# Patient Record
Sex: Male | Born: 1955 | Race: White | Hispanic: No | Smoking: Never smoker
Health system: Southern US, Community
[De-identification: ages and names within clinical notes are randomized; demographics above are authoritative.]

## PROBLEM LIST (undated history)

## (undated) DIAGNOSIS — E785 Hyperlipidemia, unspecified: Secondary | ICD-10-CM

## (undated) DIAGNOSIS — N200 Calculus of kidney: Secondary | ICD-10-CM

## (undated) HISTORY — DX: Hyperlipidemia, unspecified: E78.5

---

## 2009-03-29 ENCOUNTER — Ambulatory Visit: Payer: Self-pay | Admitting: Family Medicine

## 2009-03-29 DIAGNOSIS — E785 Hyperlipidemia, unspecified: Secondary | ICD-10-CM | POA: Insufficient documentation

## 2009-03-29 DIAGNOSIS — H00019 Hordeolum externum unspecified eye, unspecified eyelid: Secondary | ICD-10-CM

## 2009-06-21 ENCOUNTER — Ambulatory Visit: Payer: Self-pay | Admitting: Family Medicine

## 2010-01-01 ENCOUNTER — Ambulatory Visit: Payer: Self-pay | Admitting: Family Medicine

## 2010-01-01 DIAGNOSIS — H900 Conductive hearing loss, bilateral: Secondary | ICD-10-CM | POA: Insufficient documentation

## 2010-02-28 ENCOUNTER — Encounter: Payer: Self-pay | Admitting: Family Medicine

## 2010-02-28 LAB — CONVERTED CEMR LAB
Alkaline Phosphatase: 55 units/L (ref 39–117)
BUN: 16 mg/dL (ref 6–23)
Cholesterol: 232 mg/dL — ABNORMAL HIGH (ref 0–200)
Glucose, Bld: 91 mg/dL (ref 70–99)
HDL: 41 mg/dL (ref >39)
LDL Cholesterol: 140 mg/dL — ABNORMAL HIGH (ref 0–99)
Total Bilirubin: 0.6 mg/dL (ref 0.3–1.2)
Triglycerides: 256 mg/dL — ABNORMAL HIGH (ref <150)
VLDL: 51 mg/dL — ABNORMAL HIGH (ref 0–40)

## 2010-06-11 ENCOUNTER — Ambulatory Visit: Payer: Self-pay | Admitting: Family Medicine

## 2010-08-06 NOTE — Assessment & Plan Note (Signed)
Summary: NOV stye   Vital Signs:  Patient profile:   55 year old male Height:      71 inches Weight:      195 pounds BMI:     27.30 O2 Sat:      97 % on Room air Pulse rate:   60 / minute BP sitting:   127 / 83  (left arm) Cuff size:   large  Vitals Entered By: Payton Spark CMA (January 01, 2010 1:38 PM)  O2 Flow:  Room air CC: New to est. Stye R eye x months. Also didn't do well on hearing test for DOT physical.  Hearing Screen 40db HL: Left  500 hz: No Response 1000 hz: 25db 2000 hz: 25db 4000 hz: 40db Right  500 hz: No Response 1000 hz: 25db 2000 hz: 25db 4000 hz: 40db    Primary Care Provider:  Seymour Bars DO  CC:  New to est. Stye R eye x months. Also didn't do well on hearing test for DOT physical..  History of Present Illness: 55 yo WM presents for NOV.  He is a Naval architect.  He had high cholesterol in the past.  It has been 2 yrs since labs were drawn.    Has had a stye on the R eye for severeal months.  Use Gentamycin opthalmic but it did not help.  No vision impairement.  Using OTC genteal eyedrops.  He does rub his eyes a lot.  When he went for his DOT physical, he failed his whisper test and did the hearing booth but couldn't focus on hearing b/c his heart was pounded.    Current Medications (verified): 1)  Gentamycin Opth Ointment .... Apply Small Amt To Right Eye Three Times Daily  Allergies (verified): No Known Drug Allergies  Past History:  Past Medical History: Reviewed history from 03/29/2009 and no changes required. Hyperlipidemia  Past Surgical History: Reviewed history from 03/29/2009 and no changes required. Denies surgical history  Family History: Mother, HTN Father, UNK  no sibblings  Social History: 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    Review of Systems       no fevers/sweats/weakness, unexplained wt loss/gain, no change in vision, no difficulty hearing, ringing in ears, no hay  fever/allergies, no CP/discomfort, no palpitations, no breast lump/nipple discharge, no cough/wheeze, no blood in stool, no N/V/D, no nocturia, no leaking urine, no unusual vag bleeding, no vaginal/penile discharge, no muscle/joint pain, no rash, no new/changing mole, no HA, no memory loss, no anxiety, no sleep problem, no depression, no unexplained lumps, no easy bruising/bleeding, no concern with sexual function    Physical Exam  General:  Well-developed,well-nourished,in no acute distress; alert,appropriate and cooperative throughout examination Head:  normocephalic and atraumatic.   Eyes:  tiny hordoleum on the upper and lower lids (inside), conjunctiva clear Nose:  no nasal discharge.   Mouth:  good dentition and pharynx pink and moist.   Neck:  no masses.   Lungs:  Normal respiratory effort, chest expands symmetrically. Lungs are clear to auscultation, no crackles or wheezes. Heart:  Normal rate and regular rhythm. S1 and S2 normal without gallop, murmur, click, rub or other extra sounds. Skin:  color normal.   Psych:  good eye contact, not anxious appearing, and not depressed appearing.     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 Did not improve with  topical gentamycin. Treat with warm water compresses and cleaning lids with baby shampoo and warm water. If not improved in 2 wks, refer to Dr Pecola Leisure.  Problem # 2:  HYPERLIPIDEMIA (ICD-272.4) Will update his fasting labs and f/u results.  Schedule CPE in 6 mos. 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  Since he has to pass his DOT in 2 yrs, suggest formal audology visit prior to this.  Orders: Audiometry [id number])  Complete Medication  List: 1)  Fish Oil 1000 Mg Caps (Omega-3 fatty acids) 2)  Vitamin B-12 1000 Mcg Tabs (Cyanocobalamin)  Other Orders: T-Comprehensive Metabolic Panel ([id number]) T-PSA ([id number])  Patient Instructions: 1)  Clean eyelid with a drop of baby shampoo with warm water on a washcloth from inside to outside corner 2 x a day.  Use plain warm water on a washclot during the day - apply for 5-10 min 3 x  day.   2)  If stye not improving in 2-3 wks, call and will get you into Dr Pecola Leisure. 3)  Update fasting labs. 4)  Will call you w/ results. 5)  Return for a PHYSICAL in 6 mos.   Orders Added: 1)  T-Comprehensive Metabolic Panel [[id number]] 2)  T-Lipid Profile [[id number]] 3)  T-PSA [[id number]] 4)  New Patient Level III [99203] 5)  Audiometry [34742]

## 2010-08-06 NOTE — Assessment & Plan Note (Signed)
Summary: CPE   Vital Signs:  Patient profile:   55 year old male Height:      71 inches Weight:      201 pounds BMI:     28.14 O2 Sat:      99 % on Room air Pulse rate:   65 / minute BP sitting:   124 / 78  (left arm) Cuff size:   large  Vitals Entered By: Payton Spark CMA (June 11, 2010 1:27 PM)  O2 Flow:  Room air CC: CPE. Never started crestor.   Primary Care Provider:  Seymour Bars DO  CC:  CPE. Never started crestor.Marland Kitchen  History of Present Illness: 55 yo WM presents for CPE.   He had fasting labs done in August but he was never started on Crestor becasue he was scared of it.  His tetanus was updated last year.  Had a colonoscopy done at age 29, given 10 yrs before repeat. He is due for DRE, had PSA with labs in Aug.  Denies fam hx of colon or prostate cancer or of premature heart dz.  He has been trying to eat healthier but does not regularly exercise.  Allergies (verified): No Known Drug Allergies  Past History:  Past Medical History: Reviewed history from 03/29/2009 and no changes required. Hyperlipidemia  Past Surgical History: Reviewed history from 03/29/2009 and no changes required. Denies surgical history  Family History: Reviewed history from 01/01/2010 and no changes required. Mother, HTN Father, UNK  no sibblings  Social History: Reviewed history from 01/01/2010 and no changes required. 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    Review of Systems  The patient denies anorexia, fever, weight loss, weight gain, vision loss, decreased hearing, hoarseness, chest pain, syncope, dyspnea on exertion, peripheral edema, prolonged cough, headaches, hemoptysis, abdominal pain, melena, hematochezia, severe indigestion/heartburn, hematuria, incontinence, genital sores, muscle weakness, suspicious skin lesions, transient blindness, difficulty walking, depression, unusual weight change, abnormal bleeding, enlarged lymph nodes,  angioedema, breast masses, and testicular masses.    Physical Exam  General:  alert, well-developed, well-nourished, and well-hydrated.   Head:  normocephalic and atraumatic.   Eyes:  pupils equal, pupils round, and pupils reactive to light.   Ears:  EACs patent; TMs translucent and gray with good cone of light and bony landmarks.  Nose:  no nasal discharge.   Mouth:  good dentition and pharynx pink and moist.   Neck:  no masses.   Lungs:  Normal respiratory effort, chest expands symmetrically. Lungs are clear to auscultation, no crackles or wheezes. Heart:  Normal rate and regular rhythm. S1 and S2 normal without gallop, murmur, click, rub or other extra sounds. Abdomen:  Bowel sounds positive,abdomen soft and non-tender without masses, organomegaly, no AA bruits Rectal:  No external abnormalities noted. Normal sphincter tone. No rectal masses or tenderness. hemoccult neg Prostate:  Prostate gland firm and smooth, no enlargement, nodularity, tenderness, mass, asymmetry or induration. Pulses:  2+ radial and pedal pulses Extremities:  no LE edema Skin:  color normal and no suspicious lesions.   Cervical Nodes:  No lymphadenopathy noted Psych:  good eye contact, not anxious appearing, and not depressed appearing.     Impression & Recommendations:  Problem # 1:  HEALTH MAINTENANCE EXAM (ICD-V70.0) Keeping healthy checklist for men reviewed. BP at goal.  BMI 28= overwt Working on [employer], regular exercise, wt loss. Add MVI daily, + Omega 3 Fish Oil 4 g/ day for hyperlipidemia, recheck labs  in 3 mos -- hold off on statin x 3 mos. Colonscopy due at 63. Immunizations are UTD. DRE today, normal.  PSA UTD.  Complete Medication List: 1)  Fish Oil 1000 Mg Caps (Omega-3 fatty acids) 2)  Vitamin B-12 1000 Mcg Tabs (Cyanocobalamin) 3)  Crestor 10 Mg Tabs (Rosuvastatin calcium) .Marland Kitchen.. 1 tab by mouth qhs  Other Orders: DRE (G0102)  Patient Instructions: 1)  Work on healthy/ LOW  CHOLESTEROL diet with regular exercise (aim for 45 min 4 x a wk). 2)  Add Omega 3 Fish Oil 4 grams daily.  Keep in the fridge.   3)  Recheck lipids after 3 mos.   Orders Added: 1)  Est. Patient age 71-64 [99396] 2)  DRE [G0102]

## 2010-10-14 ENCOUNTER — Inpatient Hospital Stay (INDEPENDENT_AMBULATORY_CARE_PROVIDER_SITE_OTHER)
Admission: RE | Admit: 2010-10-14 | Discharge: 2010-10-14 | Disposition: A | Discharge status: Home / Self Care | Payer: BLUE CROSS/BLUE SHIELD | Source: Ambulatory Visit | Attending: Emergency Medicine | Admitting: Emergency Medicine

## 2010-10-14 ENCOUNTER — Encounter: Payer: Self-pay | Admitting: Emergency Medicine

## 2010-10-14 DIAGNOSIS — S61209A Unspecified open wound of unspecified finger without damage to nail, initial encounter: Secondary | ICD-10-CM

## 2010-10-15 ENCOUNTER — Telehealth (INDEPENDENT_AMBULATORY_CARE_PROVIDER_SITE_OTHER): Payer: Self-pay | Admitting: *Deleted

## 2010-10-16 ENCOUNTER — Telehealth (INDEPENDENT_AMBULATORY_CARE_PROVIDER_SITE_OTHER): Payer: Self-pay | Admitting: *Deleted

## 2010-12-20 ENCOUNTER — Inpatient Hospital Stay (INDEPENDENT_AMBULATORY_CARE_PROVIDER_SITE_OTHER)
Admission: RE | Admit: 2010-12-20 | Discharge: 2010-12-20 | Disposition: A | Discharge status: Home / Self Care | Payer: BLUE CROSS/BLUE SHIELD | Source: Ambulatory Visit | Attending: Family Medicine | Admitting: Family Medicine

## 2010-12-20 ENCOUNTER — Encounter: Payer: Self-pay | Admitting: Family Medicine

## 2010-12-20 DIAGNOSIS — K5900 Constipation, unspecified: Secondary | ICD-10-CM

## 2010-12-20 DIAGNOSIS — R634 Abnormal weight loss: Secondary | ICD-10-CM | POA: Insufficient documentation

## 2010-12-21 ENCOUNTER — Encounter: Payer: Self-pay | Admitting: Family Medicine

## 2010-12-24 ENCOUNTER — Other Ambulatory Visit: Payer: Self-pay | Admitting: Gastroenterology

## 2010-12-26 ENCOUNTER — Other Ambulatory Visit: Payer: BLUE CROSS/BLUE SHIELD

## 2011-02-26 ENCOUNTER — Encounter: Payer: Self-pay | Admitting: Family Medicine

## 2011-03-07 ENCOUNTER — Ambulatory Visit (INDEPENDENT_AMBULATORY_CARE_PROVIDER_SITE_OTHER): Payer: BLUE CROSS/BLUE SHIELD | Admitting: Family Medicine

## 2011-03-07 DIAGNOSIS — E785 Hyperlipidemia, unspecified: Secondary | ICD-10-CM

## 2011-03-07 DIAGNOSIS — R1012 Left upper quadrant pain: Secondary | ICD-10-CM

## 2011-03-07 LAB — LIPID PANEL
Cholesterol: 198 mg/dL (ref 0–200)
Triglycerides: 267 mg/dL — ABNORMAL HIGH (ref <150)
VLDL: 53 mg/dL — ABNORMAL HIGH (ref 0–40)

## 2011-03-07 LAB — LIPASE: Lipase: 20 U/L (ref 0–75)

## 2011-03-07 NOTE — Patient Instructions (Signed)
We will call you with the lab results. 

## 2011-03-07 NOTE — Progress Notes (Signed)
  Subjective:    Patient ID: Mark Carson, male    DOB: 09-03-1955, 55 y.o.   MRN: [id number]  HPI 3 weeks of pain over the left lower chest over the ribs.  Pain is 1/10. Worse after drinks cold water.  Can last about 30 sec. A couple of months ago went to UC was having abdominal pain. 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  No radiation of pain.  No nausease.  No change in bowels. No blood in the stool. No SOB.  Says he saw Dr. [facility] in [place] and she had recommended scan to evaluate further. He says by then he was feeling better by just changing his diet.  He denies any recent GERD sxs.  NO CP or SOB. Doesn't bother him with foods or with with activity or work.    Review of Systems     Objective:   Physical Exam  Constitutional: He appears well-developed and well-nourished.  Cardiovascular: Normal rate and regular rhythm.   Pulmonary/Chest: Effort normal and breath sounds normal.  Abdominal: Soft. Bowel sounds are normal. He exhibits no distension and no mass. There is no tenderness. There is no rebound and no guarding.  Skin: Skin is warm and dry.  Psychiatric: He has a normal mood and affect. His behavior is normal.          Assessment & Plan:  LUQ pain -Gave ressurance. Consider GERD. Right now only happens for a few seconds when drinks cold water. Unlikley to be pancreatitis but will check levels. Also will check CBC to make sure no sign of infection.  Hyperlipidemia -Due for lipids as well.

## 2011-03-08 LAB — COMPLETE METABOLIC PANEL WITH GFR
AST: 15 U/L (ref 0–37)
Alkaline Phosphatase: 60 U/L (ref 39–117)
BUN: 16 mg/dL (ref 6–23)
Calcium: 9.8 mg/dL (ref 8.4–10.5)
Chloride: 105 mEq/L (ref 96–112)
Creat: 1.06 mg/dL (ref 0.50–1.35)
GFR, Est Non African American: >60 mL/min (ref >60)
Glucose, Bld: 94 mg/dL (ref 70–99)

## 2011-03-10 ENCOUNTER — Telehealth: Payer: Self-pay | Admitting: Family Medicine

## 2011-03-10 NOTE — Telephone Encounter (Signed)
Call pt: CMP is normal. Cholesterol looks much much better. Almost at goal. [place] work. Keeep up the good job.

## 2011-03-11 NOTE — Telephone Encounter (Signed)
Left message on vm

## 2011-06-09 NOTE — Progress Notes (Signed)
Summary: SPLITER IN RIGHT RING FINGER? (rm 5)   Vital Signs:  Patient Profile:   55 Years Old Male CC:      splinter in left 4th finger x yesterday Height:     71 inches Weight:      190 pounds O2 Sat:      99 % O2 treatment:    Room Air Temp:     97.8 degrees F oral Pulse rate:   59 / minute Resp:     16 per minute BP sitting:   126 / 79  (right arm) Cuff size:   regular  Vitals Entered By: Lajean Saver RN (October 14, 2010 2:29 PM)                  Updated Prior Medication List: FISH OIL 1000 MG CAPS (OMEGA-3 FATTY ACIDS)   Current Allergies: No known allergies History of Present Illness History from: patient Chief Complaint: splinter in left 4th finger x yesterday History of Present Illness: Patient got a wood splinter in his L ring finger yesterday.  It was from a clean 2x4. He was able to pull out a small piece (about 1cm in length).  Today he has noticed redness and swelling and tenderness.  He is wondering if there is still a splinter imbedding in the finger.  No meds or modalities are used.    REVIEW OF SYSTEMS Constitutional Symptoms      Denies fever, chills, night sweats, weight loss, weight gain, and fatigue.  Eyes       Denies change in vision, eye pain, eye discharge, glasses, contact lenses, and eye surgery. Ear/Nose/Throat/Mouth       Denies hearing loss/aids, change in hearing, ear pain, ear discharge, dizziness, frequent runny nose, frequent nose bleeds, sinus problems, sore throat, hoarseness, and tooth pain or bleeding.  Respiratory       Denies dry cough, productive cough, wheezing, shortness of breath, asthma, bronchitis, and emphysema/COPD.  Cardiovascular       Denies murmurs, chest pain, and tires easily with exhertion.    Gastrointestinal       Denies stomach pain, nausea/vomiting, diarrhea, constipation, blood in bowel movements, and indigestion. Genitourniary       Denies painful urination, kidney stones, and loss of urinary  control. Neurological       Denies paralysis, seizures, and fainting/blackouts. Musculoskeletal       Complains of redness and swelling.      Denies muscle pain, joint pain, joint stiffness, decreased range of motion, muscle weakness, and gout.      Comments: left 4th finger Skin       Denies bruising, unusual mles/lumps or sores, and hair/skin or nail changes.  Psych       Denies mood changes, temper/anger issues, anxiety/stress, speech problems, depression, and sleep problems. Other Comments: Splinter to left 4th finger yesterday. He pulled most of it out but still red/swollen and drainaing clear fluid. :ast TDaP < 2 years ago   Past History:  Past Medical History: Reviewed history from 03/29/2009 and no changes required. Hyperlipidemia  Past Surgical History: Reviewed history from 03/29/2009 and no changes required. Denies surgical history  Family History: Reviewed history from 01/01/2010 and no changes required. Mother, HTN Father, UNK  no sibblings  Social History: Reviewed history from 01/01/2010 and no changes required. 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   Physical Exam General appearance: well developed, well nourished, no acute distress MSE: oriented  to time, place, and person L 4th finger volar aspect with redness, slight swelling, mild tenderness and small puncture mark.  I cannot see any type of foreign object or discoloration that would indicate a retained splinter.  There is slight clear drainage from the wound.  Finger with FROM of DIP, PIP, and MCP.  Normal sensation and cap refill <2 sec. Assessment New Problems: WOUND, FINGER (ICD-883.0)   Patient Education: Patient and/or caregiver instructed in the following: Ibuprofen prn.  Plan New Medications/Changes: BACTRIM DS 800-160 MG TABS (SULFAMETHOXAZOLE-TRIMETHOPRIM) 1 by mouth two times a day for 10 days  #20 x 0, 10/14/2010, Hoyt Koch MD  New Orders: Est.  Patient Level III [phone] Planning Comments:   My main concern is with a finger felon.  I do think it's infected and will treat to cover staph & strep with Bactrim DS.  Soak in warm water.  I doubt that an Xray would be worthwhile since a wood splinter would be very difficult to see.  Perhaps an ultrasound would be better.  I have explained to the patient that these infections can easily become much worse causing abscess formation and necrosis of the tissues or osteomyelitis.  Will call him tomorrow and the next day to see how he's doing.  If not improving (or if worsening), would suggest that he see a hand surgeon for possible exploration of the wound.   The patient and/or caregiver has been counseled thoroughly with regard to medications prescribed including dosage, schedule, interactions, rationale for use, and possible side effects and they verbalize understanding.  Diagnoses and expected course of recovery discussed and will return if not improved as expected or if the condition worsens. Patient and/or caregiver verbalized understanding.  Prescriptions: BACTRIM DS 800-160 MG TABS (SULFAMETHOXAZOLE-TRIMETHOPRIM) 1 by mouth two times a day for 10 days  #20 x 0   Entered and Authorized by:   Hoyt Koch MD   Signed by:   Hoyt Koch MD on 10/14/2010   Method used:   Print then Give to Patient   RxID:   [id number]   Orders Added: 1)  Est. Patient Level III [95621]

## 2011-06-09 NOTE — Telephone Encounter (Signed)
  Phone Note Outgoing Call   Call placed by: Clemens Catholic LPN,  October 15, 2010 3:03 PM Call placed to: Patient Summary of Call: call back: left message to call back with any questions or concerns. Initial call taken by: Clemens Catholic LPN,  October 15, 2010 3:03 PM

## 2011-06-09 NOTE — Telephone Encounter (Signed)
  Phone Note Outgoing Call   Call placed by: Clemens Catholic LPN,  October 16, 2010 9:30 AM Call placed to: Patient Summary of Call: call back: called and left message for the pt to call back if he is not improving or has any questions or concerns.  Initial call taken by: Clemens Catholic LPN,  October 16, 2010 9:31 AM

## 2011-06-09 NOTE — Progress Notes (Signed)
Summary: RAPID WEIGHT LOSS (rm 2)   Vital Signs:  Patient Profile:   55 Years Old Male CC:      weight loss Height:     71 inches Weight:      180.50 pounds BMI:     25.27 O2 Sat:      98 % O2 treatment:    Room Air Temp:     98.0 degrees F oral Pulse rate:   60 / minute Resp:     16 per minute BP sitting:   123 / 79  (left arm) Cuff size:   regular  Vitals Entered By: Lajean Saver RN (December 20, 2010 11:42 AM)                  Updated Prior Medication List: FISH OIL 1000 MG CAPS (OMEGA-3 FATTY ACIDS)   Current Allergies: No known allergies History of Present Illness Chief Complaint: weight loss History of Present Illness:  Subjective:  Patient complains of developing an episode of constipation about 3 weeks ago that lasted about a week.  During that time his appetite was decreased and he developed mild nausea without vomiting.  He also lost weight.  The constipation resolved after taking Dulcolax.  His weight stabilized afterwards.  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  3 days ago he again developed constipation and increased bloating/gas.  Also had mild nausea without vomiting.  No fevers, chills, and sweats.  No blood in stool.  He has had no changes in stools prior to the constipation but admits that he often has to strain during bowel movements.  He feels well otherwise. No family history of GI disorders or thyroid disorders.  REVIEW OF SYSTEMS Constitutional Symptoms       Complains of weight loss.     Denies fever, chills, night sweats, weight gain, and fatigue.  Eyes       Denies change in vision, eye pain, eye discharge, glasses, contact lenses, and eye surgery. Ear/Nose/Throat/Mouth       Denies hearing loss/aids, change in hearing, ear pain, ear discharge, dizziness, frequent runny nose, frequent nose bleeds, sinus problems, sore throat, hoarseness, and tooth pain or bleeding.  Respiratory       Denies dry cough, productive cough,  wheezing, shortness of breath, asthma, bronchitis, and emphysema/COPD.  Cardiovascular       Denies murmurs, chest pain, and tires easily with exhertion.    Gastrointestinal       Complains of stomach pain and constipation.      Denies nausea/vomiting, diarrhea, blood in bowel movements, and indigestion. Genitourniary       Denies painful urination, kidney stones, and loss of urinary control. Neurological       Denies paralysis, seizures, and fainting/blackouts. Musculoskeletal       Denies muscle pain, joint pain, joint stiffness, decreased range of motion, redness, swelling, muscle weakness, and gout.  Skin       Denies bruising, unusual mles/lumps or sores, and hair/skin or nail changes.  Psych       Denies mood changes, temper/anger issues, anxiety/stress, speech problems, depression, and sleep problems. Other Comments: Patient "was sick with a stomach virus" 3 weeks ago. He had no appetite, abd pains and c/o constipation. He c/o losing 10 pounds then. In the past week he c/o same symptoms and a loss of 5 pounds. He is concerned about his weight loss. On his scale this AM without clothes on he weighted 169. On  our scale with clothes he weighed 180.8lbs.    Past History:  Past Medical History: Reviewed history from 03/29/2009 and no changes required. Hyperlipidemia  Past Surgical History: Reviewed history from 03/29/2009 and no changes required. Denies surgical history  Family History: Reviewed history from 01/01/2010 and no changes required. Mother, HTN Father, UNK  no sibblings  Social History: Reviewed history from 01/01/2010 and no changes required. 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     Objective:  Appearance:  Patient appears healthy, stated age, and in no acute distress  Eyes:  Pupils are equal, round, and reactive to light and accomodation.  Extraocular movement is intact.  Conjunctivae are not inflamed.  Mouth/pharynx:   Normal Neck:  Supple.  No adenopathy is present.  No thyromegaly is present  Lungs:  Clear to auscultation.  Breath sounds are equal.  Heart:  Regular rate and rhythm without murmurs, rubs, or gallops.  Abdomen:  Mild tenderness over descending colon without masses or hepatosplenomegaly.  Bowel sounds are present.  No CVA or flank tenderness.  Rectal Exam:  Anus is normal without surrounding erythema.   No external hemorrhoids are present.  No lesions are palpated in the rectal vault.  Stool is heme negative.  Prostate is symmetric without tenderness or nodules.  CBC:  WBC 7.6 ; LY 35.3, MO 6.7, GR 58.0; Hgb 13.4  Assessment New Problems: WEIGHT LOSS, RECENT (ICD-783.21) CONSTIPATION (ICD-564.00)  SUSPECT CONSTIPATION A RESULT OF INADEQUATE FIBER IN DIET  Plan New Orders: CBC w/Diff [[id number]] T-CMP with estimated GFR [[address]] T-TSH [09811-91478] DRE [G0102] Est. Patient Level IV [29562] Planning Comments:   Check TSH, CMP May again use Dulcolax with plenty of fluids.  May use Miralax if not resolving.   When normal bowel movements resume, begin increasing fiber in diet; instruction sheet given. If constipation continues, recommend follow-up by GI                                                                                                                                                                                                             The patient and/or caregiver has been counseled thoroughly with regard to medications prescribed including dosage, schedule, interactions, rationale for use, and possible side effects and they verbalize understanding.  Diagnoses and expected course of recovery discussed and will return if not improved as expected or if the condition worsens. Patient and/or caregiver verbalized understanding.   Orders Added: 1)  CBC w/Diff [[id number]] 2)  T-CMP with estimated GFR [[address]] 3)  T-TSH [54098-11914] 4)  DRE [G0102] 5)  Est.  Patient Level IV [78295]

## 2011-06-09 NOTE — Progress Notes (Signed)
Summary: Follow-up call  Folllowup call to patient; discussed lab results.  Note increased creatinine 1.61 (was normal 0.88 on 02/28/10).  Recommend that he repeat a BMP in about two weeks when he feels better, well hydrated. (nurse visit OK). Donna Christen MD  December 21, 2010 9:36 AM

## 2011-08-06 ENCOUNTER — Emergency Department
Admission: EM | Admit: 2011-08-06 | Discharge: 2011-08-06 | Disposition: A | Discharge status: Home / Self Care | Payer: BC Managed Care – PPO | Source: Home / Self Care | Attending: Emergency Medicine | Admitting: Emergency Medicine

## 2011-08-06 ENCOUNTER — Encounter: Payer: Self-pay | Admitting: *Deleted

## 2011-08-06 DIAGNOSIS — J029 Acute pharyngitis, unspecified: Secondary | ICD-10-CM

## 2011-08-06 DIAGNOSIS — R350 Frequency of micturition: Secondary | ICD-10-CM

## 2011-08-06 DIAGNOSIS — IMO0001 Reserved for inherently not codable concepts without codable children: Secondary | ICD-10-CM

## 2011-08-06 LAB — POCT URINALYSIS DIP (MANUAL ENTRY)
Bilirubin, UA: NEGATIVE
Glucose, UA: NEGATIVE
Ketones, POC UA: NEGATIVE
Nitrite, UA: NEGATIVE
pH, UA: 5.5 (ref 5–8)

## 2011-08-06 MED ORDER — SULFAMETHOXAZOLE-TRIMETHOPRIM 800-160 MG PO TABS: 1.0000 | ORAL_TABLET | Freq: Two times a day (BID) | ORAL | Status: AC

## 2011-08-06 NOTE — ED Notes (Signed)
Patient c/o intermittent scratchy throat and left ear "fullness" x 3 weeks. Yesterday he developed urinary frequency and dark urine.

## 2011-08-06 NOTE — ED Provider Notes (Signed)
History     CSN: [id number]  Arrival date & time 08/06/11  9562   First MD Initiated Contact with Patient 08/06/11 [phone]      Chief Complaint  Patient presents with  . Urinary Frequency    (Consider location/radiation/quality/duration/timing/severity/associated sxs/prior treatment) HPI  This patient presents with 3 weeks of intermittent symptoms including a sore throat, runny nose, and left ear discomfort. He takes Tylenol Cold which seems to resolve her symptoms completely but then they returned within a day. This has not been bothering him a. However the last 2 days he has developed urinary frequency. 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 He is not sexually active and states that he has no history of sexual transmitted diseases. No burning or discomfort. No hematuria. No fever or chills. No back pain or abdominal pain. Past Medical History  Diagnosis Date  . Hyperlipidemia     History reviewed. No pertinent past surgical history.  Family History  Problem Relation Age of Onset  . Hypertension Mother     History  Substance Use Topics  . Smoking status: Never Smoker   . Smokeless tobacco: Not on file  . Alcohol Use: 3.0 oz/week    6 drink(s) per week     per week      Review of Systems  Allergies  Review of patient's allergies indicates no known allergies.  Home Medications   Current Outpatient Rx  Name Route Sig Dispense Refill  . OMEGA-3 FATTY ACIDS 1000 MG PO CAPS Oral Take 1 g by mouth daily.      . SULFAMETHOXAZOLE-TRIMETHOPRIM 800-160 MG PO TABS Oral Take 1 tablet by mouth 2 (two) times daily. 14 tablet 0    BP 122/79  Pulse 55  Temp(Src) 97.5 F (36.4 C) (Oral)  Resp 14  Ht 5\' 11"  (1.803 m)  Wt 184 lb (83.462 kg)  BMI 25.66 kg/m2  SpO2 99%  Physical Exam  Nursing note and vitals reviewed. Constitutional: He is oriented to person, place, and time. He appears well-developed and well-nourished.    HENT:  Head: Normocephalic and atraumatic.  Right Ear: Tympanic membrane, external ear and ear canal normal.  Left Ear: Tympanic membrane, external ear and ear canal normal.  Nose: Mucosal edema and rhinorrhea present.  Mouth/Throat: Posterior oropharyngeal erythema present. No oropharyngeal exudate or posterior oropharyngeal edema.  Eyes: No scleral icterus.  Neck: Neck supple.  Cardiovascular: Regular rhythm and normal heart sounds.   Pulmonary/Chest: Effort normal and breath sounds normal. No respiratory distress.  Abdominal: Soft. Normal appearance and bowel sounds are normal. There is no tenderness. There is no rigidity, no rebound, no guarding and no CVA tenderness.  Neurological: He is alert and oriented to person, place, and time.  Skin: Skin is warm and dry.  Psychiatric: He has a normal mood and affect. His speech is normal.    ED Course  Procedures (including critical care time)   Labs Reviewed  POCT URINALYSIS DIP (MANUAL ENTRY)  URINE CULTURE   No results found.   1. Frequency   2. Acute pharyngitis       MDM   Pharyngitis is likely viral. However with this frequency and white blood cells on his urinalysis, I am going to treat him for urinary infection with Bactrim which will cover both upper respiratory infection as well as a urinary infection. A urine culture was performed. Differential diagnosis also includes kidney stones so if he is getting  worsening pain or blood in his urine that he may need a followup with a urologist or primary care Dr.  Lily Kocher, MD 08/06/11 [phone]

## 2011-08-08 LAB — URINE CULTURE
Colony Count: NO GROWTH
Organism ID, Bacteria: NO GROWTH

## 2011-09-05 ENCOUNTER — Emergency Department
Admission: EM | Admit: 2011-09-05 | Discharge: 2011-09-05 | Disposition: A | Discharge status: Home Health | Payer: BC Managed Care – PPO | Source: Home / Self Care | Attending: Family Medicine | Admitting: Family Medicine

## 2011-09-05 DIAGNOSIS — N400 Enlarged prostate without lower urinary tract symptoms: Secondary | ICD-10-CM

## 2011-09-05 DIAGNOSIS — N419 Inflammatory disease of prostate, unspecified: Secondary | ICD-10-CM

## 2011-09-05 LAB — POCT URINALYSIS DIPSTICK
Ketones, UA: NEGATIVE
Nitrite, UA: NEGATIVE
Protein, UA: NEGATIVE
pH, UA: 6.5 (ref 5–8)

## 2011-09-05 LAB — POCT URINALYSIS DIP (MANUAL ENTRY)
Leukocytes, UA: NEGATIVE
Protein Ur, POC: NEGATIVE
Urobilinogen, UA: 0.2 (ref 0–1)

## 2011-09-05 MED ORDER — CIPROFLOXACIN HCL 500 MG PO TABS: 500.0000 mg | ORAL_TABLET | Freq: Two times a day (BID) | ORAL | Status: AC

## 2011-09-05 NOTE — Discharge Instructions (Signed)
Increase fluid intake.  Try Saw Palmetto, two caps at bedtime.

## 2011-09-05 NOTE — ED Provider Notes (Signed)
History     CSN: [id number]  Arrival date & time 09/05/11  1345   None     Chief Complaint  Patient presents with  . Urinary Retention    slow stream x 6 days      HPI Comments: Patient reports that his previous urinary symptoms resolved after taking Septra DS.  He notes that he has had mild BPH symptoms for about 15 years.  Over the past week he has developed mild URI symptoms with cough and congestion now almost resolved.  However, his prostate symptoms have recurred (mild dysuria and slow stream).  No back ache or fevers, chills, and sweats.  The history is provided by the patient.    Past Medical History  Diagnosis Date  . Hyperlipidemia     History reviewed. No pertinent past surgical history.  Family History  Problem Relation Age of Onset  . Hypertension Mother     History  Substance Use Topics  . Smoking status: Never Smoker   . Smokeless tobacco: Not on file  . Alcohol Use: 3.0 oz/week    6 drink(s) per week     per week      Review of Systems No sore throat + cough, resolving No pleuritic pain No wheezing + nasal congestion No post-nasal drainage No sinus pain/pressure No itchy/red eyes No earache No hemoptysis No SOB No fever/chills No nausea No vomiting No abdominal pain No diarrhea + urine frequency. No skin rashes + fatigue No myalgias No headache Used OTC meds without relief  Allergies  Review of patient's allergies indicates no known allergies.  Home Medications   Current Outpatient Rx  Name Route Sig Dispense Refill  . CIPROFLOXACIN HCL 500 MG PO TABS Oral Take 1 tablet (500 mg total) by mouth 2 (two) times daily. 20 tablet 0  . OMEGA-3 FATTY ACIDS 1000 MG PO CAPS Oral Take 1 g by mouth daily.        BP 120/78  Pulse 64  Temp(Src) 98.2 F (36.8 C) (Oral)  Resp 16  Ht 5\' 11"  (1.803 m)  Wt 188 lb (85.276 kg)  BMI 26.22 kg/m2  SpO2 99%  Physical Exam Nursing notes and Vital Signs reviewed. Appearance:  Patient  appears healthy, stated age, and in no acute distress Eyes:  Pupils are equal, round, and reactive to light and accomodation.  Extraocular movement is intact.  Conjunctivae are not inflamed  Ears:  Canals normal.  Tympanic membranes normal.  Nose:  Mildly congested turbinates.  No sinus tenderness.   Pharynx:  Normal Neck:  Supple.  No adenopathy Lungs:  Clear to auscultation.  Breath sounds are equal.  Heart:  Regular rate and rhythm without murmurs, rubs, or gallops.  Abdomen:  Nontender without masses or hepatosplenomegaly.  Bowel sounds are present.  No CVA or flank tenderness.  Extremities:  No edema.  No calf tenderness Skin:  No rash present.  Rectal Exam:  Anus is normal without surrounding erythema. No external hemorrhoids are present.  No lesions are palpated in the rectal vault.   Prostate is symmetric, moderately enlarged, and somewhat tender.  No nodules palpated.   ED Course  Procedures  none   Labs Reviewed  POCT URINALYSIS DIP (MANUAL ENTRY) - Abnormal; Notable for the following: First specimen:  Mod blood, no leuks  POCT URINALYSIS DIPSTICK - Abnormal; Notable for the following:  Second specimen (after prostate exam):  Mod blood, trace leuks  URINE CULTURE pending      1. BPH (benign  prostatic hyperplasia)   2. Prostatitis       MDM  Appears to have a resolving URI Urine culture pending.  Begin Cipro for 10 days.  Increase fluid intake.  Try Saw Palmetto, two caps at bedtime. If symptoms persist, follow-up with urologist.        Donna Christen, MD 09/05/11 [phone]

## 2011-09-05 NOTE — ED Notes (Signed)
Patient complains of slow urine stream with an urgency to urinate. He also complains of waxing and waning nausea and constipation. He had a negative urine culture on 08/06/11. Started and then stopped Minocycline 50 mg 2 tabs q12hrs.

## 2011-09-07 ENCOUNTER — Telehealth: Payer: Self-pay

## 2012-02-12 ENCOUNTER — Encounter: Payer: Self-pay | Admitting: *Deleted

## 2012-02-12 ENCOUNTER — Emergency Department
Admission: EM | Admit: 2012-02-12 | Discharge: 2012-02-12 | Disposition: A | Discharge status: Home / Self Care | Payer: BC Managed Care – PPO | Source: Home / Self Care

## 2012-02-12 DIAGNOSIS — J069 Acute upper respiratory infection, unspecified: Secondary | ICD-10-CM

## 2012-02-12 NOTE — ED Notes (Signed)
Patient c/o sore throat and nausea x last night

## 2012-02-12 NOTE — ED Provider Notes (Signed)
History     CSN: [id number]  Arrival date & time 02/12/12  0900   First MD Initiated Contact with Patient 02/12/12 [phone]      Chief Complaint  Patient presents with  . Sore Throat   HPI URI Symptoms Onset: 2 days  Description: rhinorrhea, nasal congestion, sore throat, L sided LAD  Modifying factors:  None   Symptoms Nasal discharge: yes Fever: no Sore throat: yes Cough: no Wheezing: no Ear pain: no GI symptoms: no Sick contacts: yes  Red Flags  Stiff neck: no Dyspnea: no Rash: no Swallowing difficulty: no  Sinusitis Risk Factors Headache/face pain: mild Double sickening: no tooth pain: no  Allergy Risk Factors Sneezing: no Itchy scratchy throat: no Seasonal symptoms: no  Flu Risk Factors Headache: mild  muscle aches: no severe fatigue: no   Past Medical History  Diagnosis Date  . Hyperlipidemia     History reviewed. No pertinent past surgical history.  Family History  Problem Relation Age of Onset  . Hypertension Mother     History  Substance Use Topics  . Smoking status: Never Smoker   . Smokeless tobacco: Not on file  . Alcohol Use: 3.0 oz/week    6 drink(s) per week     per week      Review of Systems  All other systems reviewed and are negative.    Allergies  Review of patient's allergies indicates no known allergies.  Home Medications   Current Outpatient Rx  Name Route Sig Dispense Refill  . OMEGA-3 FATTY ACIDS 1000 MG PO CAPS Oral Take 1 g by mouth daily.        BP 118/75  Pulse 53  Temp 97.7 F (36.5 C) (Oral)  Resp 16  Ht 5\' 10"  (1.778 m)  Wt 190 lb (86.183 kg)  BMI 27.26 kg/m2  SpO2 100%  Physical Exam  Constitutional: He appears well-developed and well-nourished.  HENT:  Head: Normocephalic and atraumatic.  Right Ear: External ear normal.  Left Ear: External ear normal.  Mouth/Throat: No oropharyngeal exudate.       +nasal erythema, rhinorrhea bilaterally, + post oropharyngeal erythema    Eyes:  Conjunctivae are normal. Pupils are equal, round, and reactive to light.  Neck: Normal range of motion. Neck supple.       Mild L sided LAD   Cardiovascular: Normal rate and regular rhythm.   Pulmonary/Chest: Effort normal and breath sounds normal.  Abdominal: Soft. Bowel sounds are normal.  Musculoskeletal: Normal range of motion.  Neurological: He is alert.  Skin: Skin is warm.    ED Course  Procedures (including critical care time)   Labs Reviewed  POCT RAPID STREP A (OFFICE)   No results found.   1. URI (upper respiratory infection)       MDM  Viral URI.  Discussed supportive care.  Also discussed infectious and resp red flags.  Fairly early in viral course.  Discussed with pt if sxs worsened or not improved in 5-7 days, call back. Would consider Zpak at that point.      The patient and/or caregiver has been counseled thoroughly with regard to treatment plan and/or medications prescribed including dosage, schedule, interactions, rationale for use, and possible side effects and they verbalize understanding. Diagnoses and expected course of recovery discussed and will return if not improved as expected or if the condition worsens. Patient and/or caregiver verbalized understanding.             Floydene Flock, MD 02/12/12 [phone]

## 2012-02-19 NOTE — ED Provider Notes (Signed)
Agree with exam, assessment, and plan.   Lattie Haw, MD 02/19/12 [phone]

## 2012-04-07 ENCOUNTER — Telehealth: Payer: Self-pay | Admitting: *Deleted

## 2012-04-07 NOTE — Telephone Encounter (Signed)
That will be fine if patient does not have any concerns. Let him know if he does have concerns then we probably should meet first to see if there is anything else that needs to be ordered.

## 2012-04-07 NOTE — Telephone Encounter (Signed)
Pt has an appt with you on 04/09/12. What labs other than cbc, lipid and cmp would you like. He wants to have his labs drawn.

## 2012-04-08 NOTE — Telephone Encounter (Signed)
LMOM for pt to call with what he would like to do.

## 2012-04-09 ENCOUNTER — Encounter: Payer: BC Managed Care – PPO | Admitting: Physician Assistant

## 2012-04-09 NOTE — Telephone Encounter (Signed)
LMOM

## 2012-05-21 ENCOUNTER — Encounter: Payer: BC Managed Care – PPO | Admitting: Family Medicine

## 2012-05-21 ENCOUNTER — Ambulatory Visit (INDEPENDENT_AMBULATORY_CARE_PROVIDER_SITE_OTHER): Payer: BC Managed Care – PPO | Admitting: Physician Assistant

## 2012-05-21 ENCOUNTER — Encounter: Payer: Self-pay | Admitting: Physician Assistant

## 2012-05-21 VITALS — BP 96/64 | HR 59 | Ht 70.0 in | Wt 194.0 lb

## 2012-05-21 DIAGNOSIS — Z23 Encounter for immunization: Secondary | ICD-10-CM

## 2012-05-21 DIAGNOSIS — Z131 Encounter for screening for diabetes mellitus: Secondary | ICD-10-CM

## 2012-05-21 DIAGNOSIS — Z0000 Encounter for general adult medical examination without abnormal findings: Secondary | ICD-10-CM

## 2012-05-21 DIAGNOSIS — E785 Hyperlipidemia, unspecified: Secondary | ICD-10-CM

## 2012-05-21 DIAGNOSIS — N400 Enlarged prostate without lower urinary tract symptoms: Secondary | ICD-10-CM

## 2012-05-21 NOTE — Patient Instructions (Addendum)
Continue diet and exercise;  Will call with lab results

## 2012-05-21 NOTE — Progress Notes (Signed)
  Subjective:    Patient ID: Mark Carson, male    DOB: July 07, 1956, 56 y.o.   MRN: [id number]  HPI Patient is a 56 year old male who presents to the clinic for complete physical. Past medical history reviewed and positive for hyperlipidemia, BPH. Hyperlipidemia has been well controlled with diet and exercise past. [facility] is currently seen [facility] for BPH issues and taking Flomax daily. He does report to be having slow stream issues. Patient has that complaint or concern. He has a relatively healthy diet per patient and exercises on elliptical 3 times a week for least 30 minutes.  Family, social, surgical, medications were reviewed and changed if needed.  Review of Systems     Objective:   Physical Exam BP 96/64  Pulse 59  Ht 5\' 10"  (1.778 m)  Wt 194 lb (87.998 kg)  BMI 27.84 kg/m2  General Appearance:    Alert, cooperative, no distress, appears stated age  Head:    Normocephalic, without obvious abnormality, atraumatic  Eyes:    PERRL, conjunctiva/corneas clear, EOM's intact, fundi    benign, both eyes       Ears:    Normal TM's and external ear canals, both ears  Nose:   Nares normal, septum midline, mucosa normal, no drainage    or sinus tenderness  Throat:   Lips, mucosa, and tongue normal; teeth and gums normal  Neck:   Supple, symmetrical, trachea midline, no adenopathy;       thyroid:  No enlargement/tenderness/nodules; no carotid   bruit or JVD  Back:     Symmetric, no curvature, ROM normal, no CVA tenderness  Lungs:     Clear to auscultation bilaterally, respirations unlabored  Chest wall:    No tenderness or deformity  Heart:    Regular rate and rhythm, S1 and S2 normal, no murmur, rub   or gallop  Abdomen:     Soft, non-tender, bowel sounds active all four quadrants,    no masses, no organomegaly  Genitalia:    Normal male without lesion, discharge or tenderness  Rectal:    Enlarged Prostate with no masses palpated.   guaiac negative stool  Extremities:    Extremities normal, atraumatic, no cyanosis or edema  Pulses:   2+ and symmetric all extremities  Skin:   Skin color, texture, turgor normal, no rashes or lesions  Lymph nodes:   Cervical, supraclavicular, and axillary nodes normal  Neurologic:   CNII-XII intact. Normal strength, sensation and reflexes      throughout         Assessment & Plan:  CPE/Hyperlipidemia/BPH- we'll order fasting labs for cholesterol and to screen for diabetes. Patient received shingles vaccine today. All other vaccines up-to-date. It is only been 3 years since last colonoscopy. Patient encouraged to keep up diet and exercise. Encouraged patient to at least 4 servings of dairy a day. We'll call with lab results of cholesterol. Continue to followup with [facility] for enlarged prostate and symptoms.   Patient declined flu shot.

## 2012-05-22 LAB — CBC WITH DIFFERENTIAL/PLATELET
Eosinophils Relative: 3 % (ref 0–5)
HCT: 44.9 % (ref 39.0–52.0)
Hemoglobin: 14.7 g/dL (ref 13.0–17.0)
Lymphocytes Relative: 25 % (ref 12–46)
Lymphs Abs: 2.1 10*3/uL (ref 0.7–4.0)
MCV: 88.6 fL (ref 78.0–100.0)
Monocytes Absolute: 0.8 10*3/uL (ref 0.1–1.0)
Monocytes Relative: 9 % (ref 3–12)
RBC: 5.07 MIL/uL (ref 4.22–5.81)
WBC: 8.6 10*3/uL (ref 4.0–10.5)

## 2012-05-22 LAB — COMPLETE METABOLIC PANEL WITH GFR
AST: 18 U/L (ref 0–37)
Albumin: 4.6 g/dL (ref 3.5–5.2)
Alkaline Phosphatase: 65 U/L (ref 39–117)
BUN: 18 mg/dL (ref 6–23)
Potassium: 4.1 mEq/L (ref 3.5–5.3)
Total Bilirubin: 0.9 mg/dL (ref 0.3–1.2)

## 2012-05-22 LAB — LIPID PANEL
Cholesterol: 199 mg/dL (ref 0–200)
Total CHOL/HDL Ratio: 5.4 Ratio
VLDL: 49 mg/dL — ABNORMAL HIGH (ref 0–40)

## 2014-02-24 ENCOUNTER — Encounter: Payer: Self-pay | Admitting: Emergency Medicine

## 2014-02-24 ENCOUNTER — Emergency Department (INDEPENDENT_AMBULATORY_CARE_PROVIDER_SITE_OTHER): Payer: BC Managed Care – PPO

## 2014-02-24 ENCOUNTER — Emergency Department
Admission: EM | Admit: 2014-02-24 | Discharge: 2014-02-24 | Disposition: A | Discharge status: Home / Self Care | Payer: BC Managed Care – PPO | Source: Home / Self Care | Attending: Family Medicine | Admitting: Family Medicine

## 2014-02-24 DIAGNOSIS — R0781 Pleurodynia: Secondary | ICD-10-CM

## 2014-02-24 DIAGNOSIS — M549 Dorsalgia, unspecified: Secondary | ICD-10-CM

## 2014-02-24 DIAGNOSIS — R079 Chest pain, unspecified: Secondary | ICD-10-CM

## 2014-02-24 HISTORY — DX: Calculus of kidney: N20.0

## 2014-02-24 NOTE — ED Notes (Signed)
Right low back pain started 2 days ago

## 2014-02-24 NOTE — Discharge Instructions (Signed)
Apply ice pack for 20 minutes, 2 or 3 times daily.  May take Ibuprofen 200mg , 4 tabs every 8 hours with food.    Chest Wall Pain Chest wall pain is pain in or around the bones and muscles of your chest. It may take up to 6 weeks to get better. It may take longer if you must stay physically active in your work and activities.  CAUSES  Chest wall pain may happen on its own. However, it may be caused by:  A viral illness like the flu.  Injury.  Coughing.  Exercise.  Arthritis.  Fibromyalgia.  Shingles. HOME CARE INSTRUCTIONS   Avoid overtiring physical activity. Try not to strain or perform activities that cause pain. This includes any activities using your chest or your abdominal and side muscles, especially if heavy weights are used.  Put ice on the sore area.  Put ice in a plastic bag.  Place a towel between your skin and the bag.  Leave the ice on for 15-20 minutes per hour while awake for the first 2 days.  Only take over-the-counter or prescription medicines for pain, discomfort, or fever as directed by your caregiver. SEEK IMMEDIATE MEDICAL CARE IF:   Your pain increases, or you are very uncomfortable.  You have a fever.  Your chest pain becomes worse.  You have new, unexplained symptoms.  You have nausea or vomiting.  You feel sweaty or lightheaded.  You have a cough with phlegm (sputum), or you cough up blood. MAKE SURE YOU:   Understand these instructions.  Will watch your condition.  Will get help right away if you are not doing well or get worse. Document Released: 06/23/2005 Document Revised: 09/15/2011 Document Reviewed: 02/17/2011 [facility] Patient Information 2015 [place], [place]. This information is not intended to replace advice given to you by your health care provider. Make sure you discuss any questions you have with your health care provider.

## 2014-02-24 NOTE — ED Provider Notes (Signed)
CSN: [id number]     Arrival date & time 02/24/14  1228 History   First MD Initiated Contact with Patient 02/24/14 1307     Chief Complaint  Patient presents with  . Back Pain      HPI Comments: Patient developed onset of pain in his right back two days ago and is concerned that he may have a kidney stone (he has a past history of kidney stones.)  However, he has had no urinary symptoms or hematuria, and the pain has not migrated and does not radiate.  He recalls no injury to his back.  The pain is constant and somewhat worse with deep inspiration.  No fevers, chills, and sweats.  No cough or URI symptoms.  Patient is a 58 y.o. male presenting with back pain. The history is provided by the patient.  Back Pain Pain location: right lower thoracic area. Quality:  Stabbing Radiates to:  Does not radiate Pain severity:  Mild Pain is:  Same all the time Onset quality:  Sudden Duration:  2 days Timing:  Constant Progression:  Unchanged Chronicity:  New Relieved by:  Nothing Worsened by:  Coughing and deep breathing Ineffective treatments:  None tried Associated symptoms: no abdominal pain, no chest pain, no dysuria, no fever, no numbness, no paresthesias and no weakness     Past Medical History  Diagnosis Date  . Hyperlipidemia   . Kidney stones    History reviewed. No pertinent past surgical history. Family History  Problem Relation Age of Onset  . Hypertension Mother    History  Substance Use Topics  . Smoking status: Never Smoker   . Smokeless tobacco: Not on file  . Alcohol Use: 3.0 oz/week    6 drink(s) per week     Comment: per week    Review of Systems  Constitutional: Negative for fever.  Cardiovascular: Negative for chest pain.  Gastrointestinal: Negative for abdominal pain.  Genitourinary: Negative for dysuria.  Musculoskeletal: Positive for back pain.  Neurological: Negative for weakness, numbness and paresthesias.    Allergies  Review of patient's allergies  indicates not on file.  Home Medications   Prior to Admission medications   Medication Sig Start Date End Date Taking? Authorizing Provider  fish oil-omega-3 fatty acids 1000 MG capsule Take 1 g by mouth daily.      Historical Provider, MD  minocycline (DYNACIN) 50 MG tablet Take 25 mg by mouth daily.    Historical Provider, MD  Tamsulosin HCl (FLOMAX) 0.4 MG CAPS Take 0.8 mg by mouth at bedtime.    Historical Provider, MD   BP 128/79  Pulse 57  Temp(Src) 97.8 F (36.6 C) (Oral)  Ht 5\' 11"  (1.803 m)  Wt 182 lb (82.555 kg)  BMI 25.40 kg/m2  SpO2 99% Physical Exam  Nursing note and vitals reviewed. Constitutional: He is oriented to person, place, and time. He appears well-developed and well-nourished. No distress.  HENT:  Head: Normocephalic.  Mouth/Throat: Oropharynx is clear and moist.  Eyes: Conjunctivae are normal. Pupils are equal, round, and reactive to light.  Neck: Neck supple.  Cardiovascular: Normal heart sounds.   Pulmonary/Chest: Breath sounds normal. He has no wheezes. He has no rales.   He exhibits tenderness.  Back reveals distinct enderness over right 12th rib and intercostal muscle.  No rash.  No CVA or flank tenderness.  Abdominal: Bowel sounds are normal. There is no tenderness.  Musculoskeletal: He exhibits no edema.  Lymphadenopathy:    He has no cervical adenopathy.  Neurological: He is alert and oriented to person, place, and time.  Skin: Skin is warm and dry. No rash noted.    ED Course  Procedures  none   Labs Reviewed -  POCT Urinalysis:  Negative   Imaging Review Dg Ribs Unilateral W/chest Right  02/24/2014   CLINICAL DATA:  Right lower rib pain  EXAM: RIGHT RIBS AND CHEST - 3+ VIEW  COMPARISON:  None.  FINDINGS: Three views right ribs submitted. No acute infiltrate or pulmonary edema. No rib fracture. No pneumothorax.  IMPRESSION: Negative.   Electronically Signed   By: Natasha MeadLiviu  Pop M.D.   On: 02/24/2014 13:36     MDM   1. Rib pain on right  side, ?etiology     Apply ice pack for 20 minutes, 2 or 3 times daily.  May take Ibuprofen 200mg , 4 tabs every 8 hours with food.  Followup with Family Doctor if not improved in one week.     Lattie HawStephen A Kaydance Bowie, MD 02/25/14 [phone]

## 2014-02-27 ENCOUNTER — Telehealth: Payer: Self-pay | Admitting: Emergency Medicine

## 2014-02-28 LAB — POCT URINALYSIS DIP (MANUAL ENTRY)
BILIRUBIN UA: NEGATIVE
Bilirubin, UA: NEGATIVE
Blood, UA: NEGATIVE
Glucose, UA: NEGATIVE
LEUKOCYTES UA: NEGATIVE
NITRITE UA: NEGATIVE
PH UA: 6.5 (ref 5–8)
PROTEIN UA: NEGATIVE
Spec Grav, UA: 1.01 (ref 1.005–1.03)
UROBILINOGEN UA: 0.2 (ref 0–1)

## 2014-03-24 ENCOUNTER — Ambulatory Visit (INDEPENDENT_AMBULATORY_CARE_PROVIDER_SITE_OTHER): Payer: BC Managed Care – PPO | Admitting: Physician Assistant

## 2014-03-24 ENCOUNTER — Ambulatory Visit (INDEPENDENT_AMBULATORY_CARE_PROVIDER_SITE_OTHER): Payer: BC Managed Care – PPO

## 2014-03-24 ENCOUNTER — Encounter: Payer: Self-pay | Admitting: Physician Assistant

## 2014-03-24 VITALS — BP 131/81 | HR 53 | Ht 71.0 in | Wt 181.0 lb

## 2014-03-24 DIAGNOSIS — K5900 Constipation, unspecified: Secondary | ICD-10-CM

## 2014-03-24 DIAGNOSIS — Z13220 Encounter for screening for lipoid disorders: Secondary | ICD-10-CM

## 2014-03-24 DIAGNOSIS — R1084 Generalized abdominal pain: Secondary | ICD-10-CM

## 2014-03-24 DIAGNOSIS — Z131 Encounter for screening for diabetes mellitus: Secondary | ICD-10-CM

## 2014-03-24 MED ORDER — LUBIPROSTONE 8 MCG PO CAPS: 8.0000 ug | ORAL_CAPSULE | Freq: Two times a day (BID) | ORAL | Status: DC

## 2014-03-24 NOTE — Progress Notes (Signed)
   Subjective:    Patient ID: Mark Carson, male    DOB: 12-Sep-1955, 58 y.o.   MRN: [id number]  HPI Pt presents to the clinic with approximately 3 weeks of LUQ pain but moves around even into the lower groin area occasionally. Pain is not constant or severe. No new medication. No flank pain. Hx of kidney stones. Denies any urinary discomfort. Denies any fever, chills, n/v/d. Admits his stools are less frequent and harder than usual. Pt aware had on and off constipation whole life.  Denies any blood in stool, no acid reflux. He does have appt with urologist for BPH follow up next Friday. Pain seems to be worse when sitting.    Review of Systems  All other systems reviewed and are negative.      Objective:   Physical Exam  Constitutional: He is oriented to person, place, and time. He appears well-developed and well-nourished.  HENT:  Head: Normocephalic and atraumatic.  Cardiovascular: Normal rate, regular rhythm and normal heart sounds.   Pulmonary/Chest: Effort normal and breath sounds normal.  No CVA tenderness.   Abdominal:  Abdomen a little firm. No guarding or rebound. A little discomfort generalized throughout abdomen but no acute concerns.  Bowel sounds a little hypoactive.   Neurological: He is alert and oriented to person, place, and time.  Skin: Skin is dry.  Psychiatric: He has a normal mood and affect. His behavior is normal.          Assessment & Plan:  Generalized abdominal pain/constipation- will get abd xray. Suspect constipation causing some of pain. Start amitiza bid. Given samples and rx. Follow up with call in 4 days to see if helping and would like to continue rx.will get cbc.   Screening labs were ordered today.

## 2014-03-24 NOTE — Patient Instructions (Signed)

## 2014-03-30 ENCOUNTER — Telehealth: Payer: Self-pay

## 2014-03-30 NOTE — Telephone Encounter (Signed)
Patient advised of xray report. She imaging tab for documentation.

## 2014-04-08 LAB — CBC WITH DIFFERENTIAL/PLATELET
BASOS ABS: 0.1 10*3/uL (ref 0.0–0.1)
BASOS PCT: 1 % (ref 0–1)
EOS PCT: 4 % (ref 0–5)
Eosinophils Absolute: 0.3 10*3/uL (ref 0.0–0.7)
HEMATOCRIT: 42.6 % (ref 39.0–52.0)
Hemoglobin: 13.8 g/dL (ref 13.0–17.0)
Lymphocytes Relative: 28 % (ref 12–46)
Lymphs Abs: 2.3 10*3/uL (ref 0.7–4.0)
MCH: 29 pg (ref 26.0–34.0)
MCHC: 32.4 g/dL (ref 30.0–36.0)
MCV: 89.5 fL (ref 78.0–100.0)
MONO ABS: 0.7 10*3/uL (ref 0.1–1.0)
Monocytes Relative: 9 % (ref 3–12)
NEUTROS ABS: 4.8 10*3/uL (ref 1.7–7.7)
Neutrophils Relative %: 58 % (ref 43–77)
Platelets: 241 10*3/uL (ref 150–400)
RBC: 4.76 MIL/uL (ref 4.22–5.81)
RDW: 13.4 % (ref 11.5–15.5)
WBC: 8.3 10*3/uL (ref 4.0–10.5)

## 2014-04-08 LAB — COMPLETE METABOLIC PANEL WITH GFR
ALBUMIN: 4.2 g/dL (ref 3.5–5.2)
ALK PHOS: 52 U/L (ref 39–117)
ALT: 21 U/L (ref 0–53)
AST: 14 U/L (ref 0–37)
BUN: 15 mg/dL (ref 6–23)
CALCIUM: 9.6 mg/dL (ref 8.4–10.5)
CHLORIDE: 104 meq/L (ref 96–112)
CO2: 28 mEq/L (ref 19–32)
Creat: 0.97 mg/dL (ref 0.50–1.35)
GFR, EST NON AFRICAN AMERICAN: 86 mL/min
GFR, Est African American: >89 mL/min
GLUCOSE: 94 mg/dL (ref 70–99)
POTASSIUM: 4.8 meq/L (ref 3.5–5.3)
SODIUM: 139 meq/L (ref 135–145)
TOTAL PROTEIN: 6.2 g/dL (ref 6.0–8.3)
Total Bilirubin: 0.7 mg/dL (ref 0.2–1.2)

## 2014-04-08 LAB — LIPID PANEL
CHOL/HDL RATIO: 5.4 ratio
Cholesterol: 204 mg/dL — ABNORMAL HIGH (ref 0–200)
HDL: 38 mg/dL — ABNORMAL LOW (ref >39)
LDL Cholesterol: 137 mg/dL — ABNORMAL HIGH (ref 0–99)
Triglycerides: 146 mg/dL (ref <150)
VLDL: 29 mg/dL (ref 0–40)

## 2015-04-12 ENCOUNTER — Encounter: Payer: Self-pay | Admitting: Osteopathic Medicine

## 2015-04-12 ENCOUNTER — Ambulatory Visit (INDEPENDENT_AMBULATORY_CARE_PROVIDER_SITE_OTHER): Payer: BLUE CROSS/BLUE SHIELD | Admitting: Osteopathic Medicine

## 2015-04-12 VITALS — BP 115/74 | HR 58 | Temp 98.1°F | Wt 182.0 lb

## 2015-04-12 DIAGNOSIS — R5383 Other fatigue: Secondary | ICD-10-CM

## 2015-04-12 DIAGNOSIS — J029 Acute pharyngitis, unspecified: Secondary | ICD-10-CM

## 2015-04-12 DIAGNOSIS — B9789 Other viral agents as the cause of diseases classified elsewhere: Secondary | ICD-10-CM

## 2015-04-12 DIAGNOSIS — J028 Acute pharyngitis due to other specified organisms: Principal | ICD-10-CM

## 2015-04-12 DIAGNOSIS — Z13220 Encounter for screening for lipoid disorders: Secondary | ICD-10-CM | POA: Diagnosis not present

## 2015-04-12 DIAGNOSIS — J069 Acute upper respiratory infection, unspecified: Secondary | ICD-10-CM

## 2015-04-12 MED ORDER — BENZOCAINE 4 MG MT LOZG: 1.0000 | LOZENGE | OROMUCOSAL | Status: DC | PRN

## 2015-04-12 NOTE — Patient Instructions (Signed)
Viral Infections °A viral infection can be caused by different types of viruses. Most viral infections are not serious and resolve on their own. However, some infections may cause severe symptoms and may lead to further complications. °SYMPTOMS °Viruses can frequently cause: °· Minor sore throat. °· Aches and pains. °· Headaches. °· Runny nose. °· Different types of rashes. °· Watery eyes. °· Tiredness. °· Cough. °· Loss of appetite. °· Gastrointestinal infections, resulting in nausea, vomiting, and diarrhea. °These symptoms do not respond to antibiotics because the infection is not caused by bacteria. However, you might catch a bacterial infection following the viral infection. This is sometimes called a "superinfection." Symptoms of such a bacterial infection may include: °· Worsening sore throat with pus and difficulty swallowing. °· Swollen neck glands. °· Chills and a high or persistent fever. °· Severe headache. °· Tenderness over the sinuses. °· Persistent overall ill feeling (malaise), muscle aches, and tiredness (fatigue). °· Persistent cough. °· Yellow, green, or brown mucus production with coughing. °HOME CARE INSTRUCTIONS  °· Only take over-the-counter or prescription medicines for pain, discomfort, diarrhea, or fever as directed by your caregiver. °· Drink enough water and fluids to keep your urine clear or pale yellow. Sports drinks can provide valuable electrolytes, sugars, and hydration. °· Get plenty of rest and maintain proper nutrition. Soups and broths with crackers or rice are fine. °SEEK IMMEDIATE MEDICAL CARE IF:  °· You have severe headaches, shortness of breath, chest pain, neck pain, or an unusual rash. °· You have uncontrolled vomiting, diarrhea, or you are unable to keep down fluids. °· You or your child has an oral temperature above 102° F (38.9° C), not controlled by medicine. °· Your baby is older than 3 months with a rectal temperature of 102° F (38.9° C) or higher. °· Your baby is 3  months old or younger with a rectal temperature of 100.4° F (38° C) or higher. °MAKE SURE YOU:  °· Understand these instructions. °· Will watch your condition. °· Will get help right away if you are not doing well or get worse. °  °This information is not intended to replace advice given to you by your health care provider. Make sure you discuss any questions you have with your health care provider. °  °Document Released: 04/02/2005 Document Revised: 09/15/2011 Document Reviewed: 11/29/2014 °Elsevier Interactive Patient Education ©2016 [employer]. ° °

## 2015-04-12 NOTE — Progress Notes (Signed)
HPI: Mark Carson is a 59 y.o. male who presents to [facility]  today for chief complaint of:  Chief Complaint  Patient presents with  . Sore Throat    x1 week    . Location: throat . Quality: sore/scratchy . Severity: mild to moderate . Duration: sore throat x1 day, fatigue x1 week  . Assoc signs/symptoms: No fever/chills, no nausea or vomiting, no sinus pressure or drainage.    Past medical, social and family history reviewed: Past Medical History  Diagnosis Date  . Hyperlipidemia   . Kidney stones    No past surgical history on file. Social History  Substance Use Topics  . Smoking status: Never Smoker   . Smokeless tobacco: Not on file  . Alcohol Use: 3.0 oz/week    6 drink(s) per week     Comment: per week   Family History  Problem Relation Age of Onset  . Hypertension Mother     Current Outpatient Prescriptions  Medication Sig Dispense Refill  . fish oil-omega-3 fatty acids 1000 MG capsule Take 1 g by mouth daily.       No current facility-administered medications for this visit.   No Known Allergies    Review of Systems: CONSTITUTIONAL: Neg fever/chills, no unintentional weight changes, positive fatigue 1 week  HEAD/EYES/EARS/NOSE/THROAT: No headache/vision change or hearing change, positive sore throat 1 day  CARDIAC: No chest pain/pressure/palpitations, no orthopnea RESPIRATORY: No cough/shortness of breath/wheeze GASTROINTESTINAL: No nausea/vomiting/abdominal pain/b MUSCULOSKELETAL: No myalgia/arthralgia SKIN: No rash/wounds/concerning lesions   Exam:  BP 115/74 mmHg  Pulse 58  Temp(Src) 98.1 F (36.7 C) (Oral)  Wt 182 lb (82.555 kg)  SpO2 99% Constitutional: VSS, see above. General Appearance: alert, well-developed, well-nourished, NAD Eyes: Normal lids and conjunctive, non-icteric sclera, PERRLA Ears, Nose, Mouth, Throat: Normal external inspection ears/nares/mouth/lips/gums, Normal TM bilaterally, MMM,  posterior pharynx without erythema/exudate Neck: No masses, trachea midline. No thyroid enlargement/tenderness/mass appreciated Respiratory: Normal respiratory effort. no wheeze/rhonchi/rales Cardiovascular: S1/S2 normal, no murmur/rub/gallop auscultated. RRR.    No results found for this or any previous visit (from the past 72 hour(s)).    ASSESSMENT/PLAN:  Sore throat (viral) - Plan: Benzocaine 4 MG LOZG  Viral URI  Other fatigue - Plan: TSH, CBC with Differential/Platelet, COMPLETE METABOLIC PANEL WITH GFR  Screening, lipid - Plan: Lipid panel   Most likely viral illness, patient advised to return to clinic if not feeling better within the next 7-10 days, short duration of symptoms and no concern for bacterial infection at this time. We'll obtain blood work since he is due anyway, rule out any other concern for possible fatigue syndrome. Will follow-up if symptoms persist.

## 2015-05-12 LAB — COMPLETE METABOLIC PANEL WITH GFR
ALBUMIN: 4.4 g/dL (ref 3.6–5.1)
ALK PHOS: 56 U/L (ref 40–115)
ALT: 22 U/L (ref 9–46)
AST: 17 U/L (ref 10–35)
BUN: 18 mg/dL (ref 7–25)
CALCIUM: 10 mg/dL (ref 8.6–10.3)
CHLORIDE: 103 mmol/L (ref 98–110)
CO2: 21 mmol/L (ref 20–31)
Creat: 0.95 mg/dL (ref 0.70–1.33)
GFR, EST NON AFRICAN AMERICAN: 88 mL/min (ref >=60)
GFR, Est African American: >89 mL/min (ref >=60)
GLUCOSE: 87 mg/dL (ref 65–99)
POTASSIUM: 4.5 mmol/L (ref 3.5–5.3)
SODIUM: 141 mmol/L (ref 135–146)
Total Bilirubin: 1.2 mg/dL (ref 0.2–1.2)
Total Protein: 6.8 g/dL (ref 6.1–8.1)

## 2015-05-12 LAB — CBC WITH DIFFERENTIAL/PLATELET
BASOS ABS: 0 10*3/uL (ref 0.0–0.1)
Basophils Relative: 0 % (ref 0–1)
Eosinophils Absolute: 0.3 10*3/uL (ref 0.0–0.7)
Eosinophils Relative: 3 % (ref 0–5)
HCT: 43.7 % (ref 39.0–52.0)
Hemoglobin: 14.8 g/dL (ref 13.0–17.0)
LYMPHS ABS: 2.3 10*3/uL (ref 0.7–4.0)
LYMPHS PCT: 27 % (ref 12–46)
MCH: 29.7 pg (ref 26.0–34.0)
MCHC: 33.9 g/dL (ref 30.0–36.0)
MCV: 87.6 fL (ref 78.0–100.0)
MPV: 9.8 fL (ref 8.6–12.4)
Monocytes Absolute: 0.8 10*3/uL (ref 0.1–1.0)
Monocytes Relative: 9 % (ref 3–12)
NEUTROS PCT: 61 % (ref 43–77)
Neutro Abs: 5.2 10*3/uL (ref 1.7–7.7)
Platelets: 269 10*3/uL (ref 150–400)
RBC: 4.99 MIL/uL (ref 4.22–5.81)
RDW: 14 % (ref 11.5–15.5)
WBC: 8.5 10*3/uL (ref 4.0–10.5)

## 2015-05-12 LAB — LIPID PANEL
CHOL/HDL RATIO: 6.1 ratio — AB (ref <=5.0)
CHOLESTEROL: 237 mg/dL — AB (ref 125–200)
HDL: 39 mg/dL — AB (ref >=40)
LDL Cholesterol: 166 mg/dL — ABNORMAL HIGH (ref <130)
Triglycerides: 162 mg/dL — ABNORMAL HIGH (ref <150)
VLDL: 32 mg/dL — AB (ref <30)

## 2015-05-12 LAB — TSH: TSH: 3.237 u[IU]/mL (ref 0.350–4.500)

## 2015-05-25 ENCOUNTER — Encounter: Payer: Self-pay | Admitting: Physician Assistant

## 2015-05-25 DIAGNOSIS — S0292XA Unspecified fracture of facial bones, initial encounter for closed fracture: Secondary | ICD-10-CM | POA: Insufficient documentation

## 2016-07-10 ENCOUNTER — Telehealth: Payer: Self-pay | Admitting: *Deleted

## 2016-07-10 DIAGNOSIS — E785 Hyperlipidemia, unspecified: Secondary | ICD-10-CM

## 2016-07-10 DIAGNOSIS — Z0000 Encounter for general adult medical examination without abnormal findings: Secondary | ICD-10-CM

## 2016-07-10 DIAGNOSIS — N400 Enlarged prostate without lower urinary tract symptoms: Secondary | ICD-10-CM

## 2016-07-10 LAB — COMPLETE METABOLIC PANEL WITH GFR
ALBUMIN: 4.2 g/dL (ref 3.6–5.1)
ALK PHOS: 51 U/L (ref 40–115)
ALT: 18 U/L (ref 9–46)
AST: 16 U/L (ref 10–35)
BUN: 15 mg/dL (ref 7–25)
CALCIUM: 9.5 mg/dL (ref 8.6–10.3)
CO2: 26 mmol/L (ref 20–31)
Chloride: 104 mmol/L (ref 98–110)
Creat: 0.88 mg/dL (ref 0.70–1.33)
GLUCOSE: 94 mg/dL (ref 65–99)
POTASSIUM: 4.5 mmol/L (ref 3.5–5.3)
Sodium: 140 mmol/L (ref 135–146)
TOTAL PROTEIN: 6.3 g/dL (ref 6.1–8.1)
Total Bilirubin: 1 mg/dL (ref 0.2–1.2)

## 2016-07-10 LAB — LIPID PANEL
CHOL/HDL RATIO: 5.2 ratio — AB (ref <5.0)
CHOLESTEROL: 225 mg/dL — AB (ref <200)
HDL: 43 mg/dL (ref >40)
LDL Cholesterol: 154 mg/dL — ABNORMAL HIGH (ref <100)
TRIGLYCERIDES: 138 mg/dL (ref <150)
VLDL: 28 mg/dL (ref <30)

## 2016-07-10 NOTE — Telephone Encounter (Signed)
Fasting labs ordered for up coming CPE. °

## 2016-07-11 LAB — PSA, TOTAL AND FREE
PSA, % FREE: 33 % (ref >25)
PSA, Free: 0.2 ng/mL
PSA, TOTAL: 0.6 ng/mL (ref <=4.0)

## 2016-07-21 NOTE — Telephone Encounter (Signed)
I see patient is coming in this week.  PSA looks great.  Kidney, liver, glucose looks great.  LDL is elevated will discuss at CPE.

## 2016-07-23 ENCOUNTER — Encounter: Payer: BLUE CROSS/BLUE SHIELD | Admitting: Physician Assistant

## 2016-08-06 ENCOUNTER — Ambulatory Visit (INDEPENDENT_AMBULATORY_CARE_PROVIDER_SITE_OTHER): Payer: BLUE CROSS/BLUE SHIELD | Admitting: Physician Assistant

## 2016-08-06 ENCOUNTER — Encounter: Payer: Self-pay | Admitting: Physician Assistant

## 2016-08-06 VITALS — BP 124/77 | HR 69 | Ht 71.0 in | Wt 185.0 lb

## 2016-08-06 DIAGNOSIS — E7800 Pure hypercholesterolemia, unspecified: Secondary | ICD-10-CM | POA: Insufficient documentation

## 2016-08-06 DIAGNOSIS — N401 Enlarged prostate with lower urinary tract symptoms: Secondary | ICD-10-CM

## 2016-08-06 DIAGNOSIS — R3916 Straining to void: Secondary | ICD-10-CM | POA: Diagnosis not present

## 2016-08-06 DIAGNOSIS — Z0000 Encounter for general adult medical examination without abnormal findings: Secondary | ICD-10-CM

## 2016-08-06 MED ORDER — TAMSULOSIN HCL 0.4 MG PO CAPS: 0.4000 mg | ORAL_CAPSULE | Freq: Every day | ORAL | 5 refills | Status: DC

## 2016-08-06 NOTE — Patient Instructions (Addendum)
Red Yeast Rice 1200mg  twice a day.    Keeping You Healthy  Get These Tests  Blood Pressure- Have your blood pressure checked by your healthcare provider at least once a year.  Normal blood pressure is 120/80.  Weight- Have your body mass index (BMI) calculated to screen for obesity.  BMI is a measure of body fat based on height and weight.  You can calculate your own BMI at https://www.west-esparza.com/www.[email or web address]/bmi/  Cholesterol- Have your cholesterol checked every year.  Diabetes- Have your blood sugar checked every year if you have high blood pressure, high cholesterol, a family history of diabetes or if you are overweight.  Pap Test - Have a pap test every 1 to 5 years if you have been sexually active.  If you are older than 65 and recent pap tests have been normal you may not need additional pap tests.  In addition, if you have had a hysterectomy  for benign disease additional pap tests are not necessary.  Mammogram-Yearly mammograms are essential for early detection of breast cancer  Screening for Colon Cancer- Colonoscopy starting at age 60. Screening may begin sooner depending on your family history and other health conditions.  Follow up colonoscopy as directed by your Gastroenterologist.  Screening for Osteoporosis- Screening begins at age 60 with bone density scanning, sooner if you are at higher risk for developing Osteoporosis.  Get these medicines  Calcium with Vitamin D- Your body requires 1200-1500 mg of Calcium a day and [phone] IU of Vitamin D a day.  You can only absorb 500 mg of Calcium at a time therefore Calcium must be taken in 2 or 3 separate doses throughout the day.  Hormones- Hormone therapy has been associated with increased risk for certain cancers and heart disease.  Talk to your healthcare provider about if you need relief from menopausal symptoms.  Aspirin- Ask your healthcare provider about taking Aspirin to prevent Heart Disease and Stroke.  Get these  Immuniztions  Flu shot- Every fall  Pneumonia shot- Once after the age of 665; if you are younger ask your healthcare provider if you need a pneumonia shot.  Tetanus- Every ten years.  Zostavax- Once after the age of 60 to prevent shingles.  Take these steps  Don't smoke- Your healthcare provider can help you quit. For tips on how to quit, ask your healthcare provider or go to www.smokefree.gov or call 1-800 QUIT-NOW.  Be physically active- Exercise 5 days a week for a minimum of 30 minutes.  If you are not already physically active, start slow and gradually work up to 30 minutes of moderate physical activity.  Try walking, dancing, bike riding, swimming, etc.  Eat a healthy diet- Eat a variety of healthy foods such as fruits, vegetables, whole grains, low fat milk, low fat cheeses, yogurt, lean meats, chicken, fish, eggs, dried beans, tofu, etc.  For more information go to www.thenutritionsource.org  Dental visit- Brush and floss teeth twice daily; visit your dentist twice a year.  Eye exam- Visit your Optometrist or Ophthalmologist yearly.  Drink alcohol in moderation- Limit alcohol intake to one drink or less a day.  Never drink and drive.  Depression- Your emotional health is as important as your physical health.  If you're feeling down or losing interest in things you normally enjoy, please talk to your healthcare provider.  Seat Belts- can save your life; always wear one  Smoke/Carbon Monoxide detectors- These detectors need to be installed on the appropriate level of your home.  Replace batteries at least once a year.  Violence- If anyone is threatening or hurting you, please tell your healthcare provider.  Living Will/ Health care power of attorney- Discuss with your healthcare provider and family.
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
## 2016-11-27 ENCOUNTER — Ambulatory Visit (INDEPENDENT_AMBULATORY_CARE_PROVIDER_SITE_OTHER): Payer: Self-pay | Admitting: Sports Medicine

## 2016-11-27 ENCOUNTER — Ambulatory Visit (INDEPENDENT_AMBULATORY_CARE_PROVIDER_SITE_OTHER): Payer: Self-pay

## 2016-11-27 ENCOUNTER — Encounter: Payer: Self-pay | Admitting: Sports Medicine

## 2016-11-27 DIAGNOSIS — M533 Sacrococcygeal disorders, not elsewhere classified: Secondary | ICD-10-CM

## 2016-11-27 DIAGNOSIS — M5136 Other intervertebral disc degeneration, lumbar region: Secondary | ICD-10-CM

## 2016-11-27 DIAGNOSIS — M51369 Other intervertebral disc degeneration, lumbar region without mention of lumbar back pain or lower extremity pain: Secondary | ICD-10-CM | POA: Insufficient documentation

## 2016-11-27 MED ORDER — PREDNISONE 50 MG PO TABS: ORAL_TABLET | ORAL | 0 refills | Status: DC

## 2016-11-27 MED ORDER — MELOXICAM 15 MG PO TABS: ORAL_TABLET | ORAL | 3 refills | Status: DC

## 2016-11-27 NOTE — Assessment & Plan Note (Signed)
We will start conservatively, prednisone, meloxicam, x-rays of the lumbar spine and sacroiliac joints, formal physical therapy. Can return to work on Tuesday. Return to see me in one month. There was evidence of osteopenia on x-ray so we are going to proceed with DEXA scan.
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
## 2016-12-26 ENCOUNTER — Ambulatory Visit: Payer: BLUE CROSS/BLUE SHIELD | Admitting: Sports Medicine

## 2016-12-30 ENCOUNTER — Other Ambulatory Visit: Payer: BLUE CROSS/BLUE SHIELD

## 2017-02-02 ENCOUNTER — Ambulatory Visit: Payer: BLUE CROSS/BLUE SHIELD | Admitting: Physician Assistant

## 2017-06-03 ENCOUNTER — Encounter: Payer: Self-pay | Admitting: Family Medicine

## 2017-06-03 ENCOUNTER — Ambulatory Visit (INDEPENDENT_AMBULATORY_CARE_PROVIDER_SITE_OTHER): Payer: Worker's Compensation | Admitting: Family Medicine

## 2017-06-03 ENCOUNTER — Ambulatory Visit (INDEPENDENT_AMBULATORY_CARE_PROVIDER_SITE_OTHER): Payer: Worker's Compensation

## 2017-06-03 VITALS — BP 125/79 | HR 65 | Ht 70.0 in | Wt 205.0 lb

## 2017-06-03 DIAGNOSIS — W19XXXA Unspecified fall, initial encounter: Secondary | ICD-10-CM

## 2017-06-03 DIAGNOSIS — S52502A Unspecified fracture of the lower end of left radius, initial encounter for closed fracture: Secondary | ICD-10-CM

## 2017-06-03 DIAGNOSIS — S6992XA Unspecified injury of left wrist, hand and finger(s), initial encounter: Secondary | ICD-10-CM

## 2017-06-03 NOTE — Patient Instructions (Addendum)
Thank you for coming in today. You should hear about CT scan shortly.  Recheck with me after the scan.  Continue the brace.   Continue out of work.

## 2017-06-03 NOTE — Progress Notes (Signed)
Subjective:    I'm seeing this patient as a consultation for:  Jomarie Longs, PA-C   CC: Left Wrist Injury  HPI: Mark Carson Was in his normal state of health on November 21. He Larey Seat at work landing on his left arm. He notes pain and swelling in the left wrist. He was seen in the emergency department on November 22 where x-rays of his wrist and arm were negative. He was thought to have a potentially radiographically occult scaphoid fracture. He was provided with thumb spica splint and was advised to follow-up in one week. I'll follow-up today he notes pain and swelling and bruising into the left wrist. He notes pain with activity despite the brace.  Past medical history, Surgical history, Family history not pertinant except as noted below, Social history, Allergies, and medications have been entered into the medical record, reviewed, and no changes needed.   Review of Systems: No headache, visual changes, nausea, vomiting, diarrhea, constipation, dizziness, abdominal pain, skin rash, fevers, chills, night sweats, weight loss, swollen lymph nodes, body aches, joint swelling, muscle aches, chest pain, shortness of breath, mood changes, visual or auditory hallucinations.   Objective:    Vitals:   06/03/17 1354  BP: 125/79  Pulse: 65   General: Well Developed, well nourished, and in no acute distress.  Neuro/Psych: Alert and oriented x3, extra-ocular muscles intact, able to move all 4 extremities, sensation grossly intact. Skin: Warm and dry, no rashes noted.  Respiratory: Not using accessory muscles, speaking in full sentences, trachea midline.  Cardiovascular: Pulses palpable, no extremity edema. Abdomen: Does not appear distended. MSK: Left wrist significant ecchymosis no significant swelling. Tender to palpation at the dorsal distal radius area and at the palmar ulnar area near the hook of the hamate. Motion is limited by pain. Pulses capillary refill and sensation are intact.    No  results found for this or any previous visit (from the past 24 hour(s)). Dg Wrist Complete Left  Result Date: 06/03/2017 CLINICAL DATA:  C/o LT radial side wrist pain after fall x 1 week ago. EXAM: LEFT WRIST - COMPLETE 3+ VIEW COMPARISON:  None. FINDINGS: There is a fracture involving the radial aspect of the distal radius, proximal to the articular surface. This likely represents an impaction fracture across the distal radius. There is no displacement or dislocation. The distal ulna is intact. Intercarpal spaces appear normal. Mild degenerative changes are identified in the carpals. IMPRESSION: Fracture of the distal radius, likely representing an impaction fracture. Electronically Signed   By: Norva Pavlov M.D.   On: 06/03/2017 21:41    Impression and Recommendations:    Assessment and Plan: 61 y.o. male with Left wrist distal radius fracture seen on x-ray. Patient is somewhat tender to this area but has significant tenderness along the oral aspect of the hand. I'm highly suspicious for apical the hamate fracture as well. This is not seen on x-ray. Plan for continued thumb spica immobilization and CT scan of the wrist to show possible hamate fracture.. Follow-up after CT scan.   Orders Placed This Encounter  Procedures  . DG Wrist Complete Left    Standing Status:   Future    Number of Occurrences:   1    Standing Expiration Date:   08/03/2018    Order Specific Question:   Reason for Exam (SYMPTOM  OR DIAGNOSIS REQUIRED)    Answer:   FOOSH 1 week ago. Include carpal tunnel view if able    Order Specific Question:  Preferred imaging location?    [facility]    Order Specific Question:   Radiology Contrast Protocol - do NOT remove file path    Answer:   [email or web address]  . CT WRIST LEFT WO CONTRAST    Standing Status:   Future    Standing Expiration Date:   09/03/2018    Order Specific Question:   Preferred imaging  location?    Answer:   [facility]    Order Specific Question:   Radiology Contrast Protocol - do NOT remove file path    Answer:   [email or web address]   No orders of the defined types were placed in this encounter.   Discussed warning signs or symptoms. Please see discharge instructions. Patient expresses understanding.

## 2017-06-12 ENCOUNTER — Ambulatory Visit (INDEPENDENT_AMBULATORY_CARE_PROVIDER_SITE_OTHER): Payer: Worker's Compensation | Admitting: Family Medicine

## 2017-06-12 ENCOUNTER — Encounter: Payer: Self-pay | Admitting: Family Medicine

## 2017-06-12 VITALS — BP 123/80 | HR 70 | Wt 208.0 lb

## 2017-06-12 DIAGNOSIS — S62102A Fracture of unspecified carpal bone, left wrist, initial encounter for closed fracture: Secondary | ICD-10-CM

## 2017-06-12 NOTE — Progress Notes (Signed)
   Mark CouncilJohn Carson is a 61 y.o. male who [facility] today for left wrist fracture.  Patient was seen on November 28 for fracture of the left wrist.  X-rays showed a distal radius fracture however his pain was felt more ulnarly.  In the interim he has had a wrist CT scan to assess for other carpal fractures and hamate fracture.  The scan was done yesterday but has not [facility].  He in the interim has been using a thumb spica splint and notes that his pain is reasonably controlled.   Past Medical History:  Diagnosis Date  . Hyperlipidemia   . Kidney stones    No past surgical history on file. Social History   Tobacco Use  . Smoking status: Never Smoker  . Smokeless tobacco: Never Used  Substance Use Topics  . Alcohol use: Yes    Alcohol/week: 3.0 oz    Types: 6 Standard drinks or equivalent per week    Comment: per week     ROS:  As above   Medications: Current Outpatient Medications  Medication Sig Dispense Refill  . fish oil-omega-3 fatty acids 1000 MG capsule Take 1 g by mouth daily.      Marland Kitchen. ibuprofen (ADVIL,MOTRIN) 600 MG tablet Take 600 mg by mouth every 6 (six) hours as needed.  0   No current facility-administered medications for this visit.    No Known Allergies   Exam:  BP 123/80   Pulse 70   Wt 208 lb (94.3 kg)   BMI 29.84 kg/m  General: Well Developed, well nourished, and in no acute distress.  Neuro/Psych: Alert and oriented x3, extra-ocular muscles intact, able to move all 4 extremities, sensation grossly intact. Skin: Warm and dry, no rashes noted.  Respiratory: Not using accessory muscles, speaking in full sentences, trachea midline.  Cardiovascular: Pulses palpable, no extremity edema. Abdomen: Does not appear distended. MSK: Left wrist slightly swollen diffusely tender but motion not tested.  Pulses capillary refill and sensation intact  A well-formed short arm cast  was applied to the left arm  No results found for this or any previous visit (from the past 48 hour(s)). No results found.    Assessment and Plan: 61 y.o. male with distal radius fracture with possible other carpal fractures.  CT pending. Follow-up in 2 weeks.  Return sooner if needed. Out of work for 1 month   No orders of the defined types were placed in this encounter.  No orders of the defined types were placed in this encounter.   Discussed warning signs or symptoms. Please see discharge instructions. Patient expresses understanding.

## 2017-06-12 NOTE — Patient Instructions (Signed)
Thank you for coming in today. Recheck in 2 weeks.  Return sooner if needed.  Keep your CD safe somewhere.    Cast or Splint Care, Adult Casts and splints are supports that are worn to protect broken bones and other injuries. A cast or splint may hold a bone still and in the correct position while it heals. Casts and splints may also help ease pain, swelling, and muscle spasms. A cast is a hardened support that is usually made of fiberglass or plaster. It is custom-fit to the body and it offers more protection than a splint. It cannot be taken off and put back on. A splint is a type of soft support that is usually made from cloth and elastic. It can be adjusted or taken off as needed. You may need a cast or a splint if you:  Have a broken bone.  Have a soft-tissue injury.  Need to keep an injured body part from moving (keep it immobile) after surgery.  How is this treated? If you have a cast:  Do not stick anything inside the cast to scratch your skin. Sticking something in the cast increases your risk of infection.  Check the skin around the cast every day. Tell your health care provider about any concerns.  You may put lotion on dry skin around the edges of the cast. Do not put lotion on the skin underneath the cast.  Keep the cast clean.  If the cast is not waterproof: ? Do not let it get wet. ? Cover it with a watertight covering when you take a bath or a shower. If you have a splint:  Wear it as told by your health care provider. Remove it only as told by your health care provider.  Loosen the splint if your fingers or toes tingle, become numb, or turn cold and blue.  Keep the splint clean.  If the splint is not waterproof: ? Do not let it get wet. ? Cover it with a watertight covering when you take a bath or a shower. Bathing  Do not take baths or swim until your health care provider approves. Ask your health care provider if you can take showers. You may only be  allowed to take sponge baths for bathing.  If your cast or splint is not waterproof, cover it with a watertight covering when you take a bath or shower. Managing pain, stiffness, and swelling  Move your fingers or toes often to avoid stiffness and to lessen swelling.  Raise (elevate) the injured area above the level of your heart while sitting or lying down. Safety  Do not use the injured limb to support your body weight until your health care provider says that it is okay.  Use crutches or other assistive devices as told by your health care provider. General instructions  Do not put pressure on any part of the cast or splint until it is fully hardened. This may take several hours.  Return to your normal activities as told by your health care provider. Ask your health care provider what activities are safe for you.  Take over-the-counter and prescription medicines only as told by your health care provider.  Keep all follow-up visits as told by your health care provider. This is important. Contact a health care provider if:  Your cast or splint gets damaged.  The skin around the cast gets red or raw.  The skin under the cast is extremely itchy or painful.  Your cast or  splint feels very uncomfortable.  Your cast or splint is too tight or too loose.  Your cast becomes wet or it develops a soft spot or area.  You get an object stuck under your cast. Get help right away if:  Your pain is getting worse.  The injured area tingles, becomes numb, or turns cold and blue.  The part of your body above or below the cast is swollen and discolored.  You cannot feel or move your fingers or toes.  There is fluid leaking through the cast.  You have severe pain or pressure under the cast.  You have trouble breathing.  You have shortness of breath.  You have chest pain. This information is not intended to replace advice given to you by your health care provider. Make sure you  discuss any questions you have with your health care provider. Document Released: 06/20/2000 Document Revised: 01/12/2016 Document Reviewed: 12/15/2015 Elsevier Interactive Patient Education  [employer]2018 Else[employer] Inc.

## 2017-06-21 ENCOUNTER — Emergency Department (INDEPENDENT_AMBULATORY_CARE_PROVIDER_SITE_OTHER)
Admission: EM | Admit: 2017-06-21 | Discharge: 2017-06-21 | Disposition: A | Discharge status: Home / Self Care | Payer: Worker's Compensation | Source: Home / Self Care | Attending: Family Medicine | Admitting: Family Medicine

## 2017-06-21 ENCOUNTER — Encounter: Payer: Self-pay | Admitting: Emergency Medicine

## 2017-06-21 DIAGNOSIS — Z4789 Encounter for other orthopedic aftercare: Secondary | ICD-10-CM | POA: Diagnosis not present

## 2017-06-21 NOTE — ED Triage Notes (Signed)
Patient had a left wrist injury last Friday, cast applied now feels that the cast is too tight, fingers are swelling, fingers discolored.

## 2017-06-21 NOTE — ED Provider Notes (Signed)
Vitals:   06/21/17 1124  BP: 120/82  Pulse: (!) 59  Temp: 98.6 F (37 C)  SpO2: 95%    Mark Carson is a 61 y.o. male presenting to UC with c/o suddenly worsening Left wrist pain with swelling and fingers discoloring due to his cast feeling too tight.  He had the cast placed 1 week ago and was doing fine until yesterday.  Denies new injury or any change in routine yesterday.  Pt is being treated by Dr. Denyse Amassorey, Sports Medicine.  Pt is scheduled to have cast removed/changed on Thursday, 06/25/17 but states he doesn't feel he can wait that long.  Left hand and wrist: hard cast in place, in tact. Fingers: full ROM, mild edema compared to Right hand. Slight decreased sensation in Left fingers vs Right per pt.  4/5 grip strength compared to Right hand. Cap refill < 2 seconds.  Consulted with Dr. Denyse Amassorey, [facility] Evaluated pt and removed cast. See consult note.  Pt to re-scheduled this weeks appointment on 12/20 for 1-2 weeks out with Dr. Denyse Amassorey.      Lurene Shadowhelps, Fatemah Pourciau O, [place] 06/21/17 1341

## 2017-06-22 ENCOUNTER — Encounter: Payer: Self-pay | Admitting: Family Medicine

## 2017-06-22 NOTE — Progress Notes (Signed)
I saw Mark RuizJohn in Urgent Care on 06/21/17 for hand pain due to cast.  I removed the cast and inspected his hand and wrist. It was free of ulceration or breakdown. He continues to have tenderness to palpitation at the ulna and distal radius.  I applied a new Exos thumb spica cast and will follow up in about 2 weeks.

## 2017-06-25 ENCOUNTER — Ambulatory Visit: Payer: Self-pay | Admitting: Family Medicine

## 2017-07-02 ENCOUNTER — Ambulatory Visit (INDEPENDENT_AMBULATORY_CARE_PROVIDER_SITE_OTHER): Payer: Worker's Compensation

## 2017-07-02 ENCOUNTER — Encounter: Payer: Self-pay | Admitting: Family Medicine

## 2017-07-02 ENCOUNTER — Ambulatory Visit (INDEPENDENT_AMBULATORY_CARE_PROVIDER_SITE_OTHER): Payer: Worker's Compensation | Admitting: Family Medicine

## 2017-07-02 VITALS — BP 133/81 | HR 76 | Ht 70.0 in | Wt 209.0 lb

## 2017-07-02 DIAGNOSIS — S62102A Fracture of unspecified carpal bone, left wrist, initial encounter for closed fracture: Secondary | ICD-10-CM | POA: Diagnosis not present

## 2017-07-02 DIAGNOSIS — W19XXXD Unspecified fall, subsequent encounter: Secondary | ICD-10-CM | POA: Diagnosis not present

## 2017-07-02 DIAGNOSIS — S59292D Other physeal fracture of lower end of radius, left arm, subsequent encounter for fracture with routine healing: Secondary | ICD-10-CM | POA: Diagnosis not present

## 2017-07-02 NOTE — Patient Instructions (Signed)
Thank you for coming in today. Recheck as scheduled in 1 week.  Return sooner if needed.

## 2017-07-02 NOTE — Progress Notes (Signed)
   Mark CouncilJohn Carson is a 61 y.o. male who [facility] today for wrist fracture.  Mark Carson returns to clinic today to follow-up his distal radius fracture.  He notes some pain and swelling in his hand and notes that the Exos cast is tight.  Feels well otherwise.  He notes the fracture is still painful.   Past Medical History:  Diagnosis Date  . Hyperlipidemia   . Kidney stones    No past surgical history on file. Social History   Tobacco Use  . Smoking status: Never Smoker  . Smokeless tobacco: Never Used  Substance Use Topics  . Alcohol use: Yes    Alcohol/week: 3.0 oz    Types: 6 Standard drinks or equivalent per week    Comment: per week     ROS:  As above   Medications: No current outpatient medications on file.   No current facility-administered medications for this visit.    No Known Allergies   Exam:  BP 133/81   Pulse 76   Ht 5\' 10"  (1.778 m)   Wt 209 lb (94.8 kg)   BMI 29.99 kg/m  General: Well Developed, well nourished, and in no acute distress.  Neuro/Psych: Alert and oriented x3, extra-ocular muscles intact, able to move all 4 extremities, sensation grossly intact. Skin: Warm and dry, no rashes noted.  Respiratory: Not using accessory muscles, speaking in full sentences, trachea midline.  Cardiovascular: Pulses palpable, no extremity edema. Abdomen: Does not appear distended. MSK: Left hand some swelling.  Tender to palpation of the distal radius.  Motion not tested.  Pulses capillary refill and sensation are intact.  Ray left wrist reveals a non-healed distal radius fracture.  No displacement noted.  Awaiting formal radiology review   No results found for this or any previous visit (from the past 48 hour(s)). No results found.    Assessment and Plan: 61 y.o. male with distal radius fracture.  Seems to be doing pretty well however Mark RuizJohn continues to have hand swelling.  I modified the Exos cast and  remolded it.  Will recheck next week.    Orders Placed This Encounter  Procedures  . DG Wrist Complete Left    Standing Status:   Future    Number of Occurrences:   1    Standing Expiration Date:   09/02/2018    Order Specific Question:   Reason for Exam (SYMPTOM  OR DIAGNOSIS REQUIRED)    Answer:   eval ? fracture change. Pain worsening    Order Specific Question:   Preferred imaging location?    Answer:   [facility]    Order Specific Question:   Radiology Contrast Protocol - do NOT remove file path    Answer:   [email or web address]   No orders of the defined types were placed in this encounter.   Discussed warning signs or symptoms. Please see discharge instructions. Patient expresses understanding.

## 2017-07-08 ENCOUNTER — Ambulatory Visit (INDEPENDENT_AMBULATORY_CARE_PROVIDER_SITE_OTHER): Payer: Worker's Compensation | Admitting: Family Medicine

## 2017-07-08 DIAGNOSIS — S52502A Unspecified fracture of the lower end of left radius, initial encounter for closed fracture: Secondary | ICD-10-CM | POA: Insufficient documentation

## 2017-07-08 DIAGNOSIS — S52592A Other fractures of lower end of left radius, initial encounter for closed fracture: Secondary | ICD-10-CM | POA: Diagnosis not present

## 2017-07-08 NOTE — Patient Instructions (Addendum)
Thank you for coming in today. Attend OT Recheck in 2 weeks or sooner if needed.

## 2017-07-08 NOTE — Progress Notes (Signed)
   Mark CouncilJohn Carson is a 62 y.o. male who [facility] today for left wrist fracture.  Patient has been seen several times for distal radius fracture.  He has been immobilized with a cast for some time now.  He has had some difficulty with the short arm cast with compression and was switched to an excess cast which you have had to modify due to hand swelling.  He denies any pain at the distal radius but does note pain along the volar aspect of his wrist with finger flexion.  He also does note some finger stiffness and pain.  The pain is interfering with his ability to work.   Past Medical History:  Diagnosis Date  . Hyperlipidemia   . Kidney stones    No past surgical history on file. Social History   Tobacco Use  . Smoking status: Never Smoker  . Smokeless tobacco: Never Used  Substance Use Topics  . Alcohol use: Yes    Alcohol/week: 3.0 oz    Types: 6 Standard drinks or equivalent per week    Comment: per week     ROS:  As above   Medications: No current outpatient medications on file.   No current facility-administered medications for this visit.    No Known Allergies   Exam:  BP 122/80   Pulse 68   Wt 212 lb (96.2 kg)   BMI 30.42 kg/m  General: Well Developed, well nourished, and in no acute distress.  Neuro/Psych: Alert and oriented x3, extra-ocular muscles intact, able to move all 4 extremities, sensation grossly intact. Skin: Warm and dry, no rashes noted.  Respiratory: Not using accessory muscles, speaking in full sentences, trachea midline.  Cardiovascular: Pulses palpable, no extremity edema. Abdomen: Does not appear distended. MSK: Left hand slightly swollen. Nontender distal radius.  Motion not tested.     No results found for this or any previous visit (from the past 48 hour(s)). No results found.    Assessment and Plan: 62 y.o. male with distal radius fracture doing well.  Cast seems to be  appropriately fitted.  Refer to occupational therapy for some hand motion and swelling relief.  Check back in 2 weeks. Work Note provided.   Orders Placed This Encounter  Procedures  . Ambulatory referral to Occupational Therapy    Referral Priority:   Routine    Referral Type:   Occupational Therapy    Referral Reason:   Specialty Services Required    Requested Specialty:   Occupational Therapy    Number of Visits Requested:   1   No orders of the defined types were placed in this encounter.   Discussed warning signs or symptoms. Please see discharge instructions. Patient expresses understanding.  I spent 15 minutes with this patient, greater than 50% was face-to-face time counseling regarding ddx and treatment plan.

## 2017-07-22 ENCOUNTER — Encounter: Payer: Self-pay | Admitting: Family Medicine

## 2017-07-22 ENCOUNTER — Ambulatory Visit (INDEPENDENT_AMBULATORY_CARE_PROVIDER_SITE_OTHER): Payer: Worker's Compensation | Admitting: Family Medicine

## 2017-07-22 ENCOUNTER — Ambulatory Visit (INDEPENDENT_AMBULATORY_CARE_PROVIDER_SITE_OTHER): Payer: Worker's Compensation

## 2017-07-22 VITALS — BP 118/76 | HR 67 | Wt 211.0 lb

## 2017-07-22 DIAGNOSIS — S52592A Other fractures of lower end of left radius, initial encounter for closed fracture: Secondary | ICD-10-CM

## 2017-07-22 DIAGNOSIS — W19XXXD Unspecified fall, subsequent encounter: Secondary | ICD-10-CM | POA: Diagnosis not present

## 2017-07-22 DIAGNOSIS — S59292D Other physeal fracture of lower end of radius, left arm, subsequent encounter for fracture with routine healing: Secondary | ICD-10-CM

## 2017-07-22 NOTE — Progress Notes (Signed)
   Mark CouncilJohn Carson is a 62 y.o. male who [facility] today for follow-up left distal radius fracture.  Mark RuizJohn has been seen several times for fracture of the left distal radius.  He has been immobilized since late November.  Continues to have hand swelling and wrist pain.  He just had his first episode with hand therapy recently and notes that them somewhat helpful.  He feels well otherwise.  Past Medical History:  Diagnosis Date  . Hyperlipidemia   . Kidney stones    No past surgical history on file. Social History   Tobacco Use  . Smoking status: Never Smoker  . Smokeless tobacco: Never Used  Substance Use Topics  . Alcohol use: Yes    Alcohol/week: 3.0 oz    Types: 6 Standard drinks or equivalent per week    Comment: per week     ROS:  As above   Medications: No current outpatient medications on file.   No current facility-administered medications for this visit.    No Known Allergies   Exam:  BP 118/76   Pulse 67   Wt 211 lb (95.7 kg)   BMI 30.28 kg/m  General: Well Developed, well nourished, and in no acute distress.  Neuro/Psych: Alert and oriented x3, extra-ocular muscles intact, able to move all 4 extremities, sensation grossly intact. Skin: Warm and dry, no rashes noted.  Respiratory: Not using accessory muscles, speaking in full sentences, trachea midline.  Cardiovascular: Pulses palpable, no extremity edema. Abdomen: Does not appear distended. MSK: Left hand and wrist still swollen.  Tender palpation of the ulnar aspect of the wrist.  Decreased motion due to pain.  X-ray left wrist shows partial healing distal radius fracture.  Awaiting formal radiology review    No results found for this or any previous visit (from the past 48 hour(s)). No results found.    Assessment and Plan: 62 y.o. male with distal radius fracture doing well.  Patient continues to have swelling and stiffness.  I think he would  benefit from a course of hand physical therapy.  Will recheck in a few weeks if not better may consider further diagnostic imaging test such as an MRI arthrogram to evaluate for ligamentous injury.    Orders Placed This Encounter  Procedures  . DG Wrist Complete Left    Standing Status:   Future    Number of Occurrences:   1    Standing Expiration Date:   09/20/2018    Order Specific Question:   Reason for Exam (SYMPTOM  OR DIAGNOSIS REQUIRED)    Answer:   eval fx    Order Specific Question:   Preferred imaging location?    [facility]    Order Specific Question:   Radiology Contrast Protocol - do NOT remove file path    Answer:   \\[email or web address]   No orders of the defined types were placed in this encounter.   Discussed warning signs or symptoms. Please see discharge instructions. Patient expresses understanding.

## 2017-07-22 NOTE — Patient Instructions (Signed)
Thank you for coming in today. Get xray today.  Continue cast.  Attend Hand PT/OT.  Recheck with me in 3 weeks.  Return sooner if needed.

## 2017-08-12 ENCOUNTER — Ambulatory Visit (INDEPENDENT_AMBULATORY_CARE_PROVIDER_SITE_OTHER): Payer: Worker's Compensation

## 2017-08-12 ENCOUNTER — Encounter: Payer: Self-pay | Admitting: Family Medicine

## 2017-08-12 ENCOUNTER — Ambulatory Visit (INDEPENDENT_AMBULATORY_CARE_PROVIDER_SITE_OTHER): Payer: Worker's Compensation | Admitting: Family Medicine

## 2017-08-12 VITALS — BP 130/81 | HR 62 | Ht 70.0 in | Wt 209.0 lb

## 2017-08-12 DIAGNOSIS — S52592D Other fractures of lower end of left radius, subsequent encounter for closed fracture with routine healing: Secondary | ICD-10-CM | POA: Diagnosis not present

## 2017-08-12 DIAGNOSIS — S52592A Other fractures of lower end of left radius, initial encounter for closed fracture: Secondary | ICD-10-CM | POA: Diagnosis not present

## 2017-08-12 DIAGNOSIS — W19XXXD Unspecified fall, subsequent encounter: Secondary | ICD-10-CM | POA: Diagnosis not present

## 2017-08-12 NOTE — Patient Instructions (Signed)
Thank you for coming in today. Continue hand therapy.  Recheck in 3 weeks.  Use the brace with activity work on hand and wrist motion at home out of the brace.

## 2017-08-12 NOTE — Progress Notes (Signed)
   Mark CouncilJohn Carson is a 62 y.o. male who presents [facility] today for follow-up left wrist fracture. Mark Carson suffered an injury to his left wrist resulting in a distal radius fracture work in November. He has been immobilized since with delayed fracture healing. He also developed hand swelling and discomfort and was referred to hand therapy to start with finger mobilization. He is feeling pretty well and denies any significant wrist pain currently.   Past Medical History:  Diagnosis Date  . Hyperlipidemia   . Kidney stones    No past surgical history on file. Social History   Tobacco Use  . Smoking status: Never Smoker  . Smokeless tobacco: Never Used  Substance Use Topics  . Alcohol use: Yes    Alcohol/week: 3.0 oz    Types: 6 Standard drinks or equivalent per week    Comment: per week     ROS:  As above   Medications: No current outpatient medications on file.   No current facility-administered medications for this visit.    No Known Allergies   Exam:  BP 130/81   Pulse 62   Ht 5\' 10"  (1.778 m)   Wt 209 lb (94.8 kg)   BMI 29.99 kg/m  General: Well Developed, well nourished, and in no acute distress.  Neuro/Psych: Alert and oriented x3, extra-ocular muscles intact, able to move all 4 extremities, sensation grossly intact. Skin: Warm and dry, no rashes noted.  Respiratory: Not using accessory muscles, speaking in full sentences, trachea midline.  Cardiovascular: Pulses palpable, no extremity edema. Abdomen: Does not appear distended. MSK:  Left wrist and hand normal-appearing no swelling nontender. Decreased motion of the left wrist present out of the cast.  X-ray left wrist shows continued slight fracture line at the distal radius with minimal callus formation. Awaiting formal radiology review    No results found for this or any previous visit (from the past 48 hour(s)). No results found.    Assessment and  Plan: 62 y.o. male with  Left distal radius fracture clinically improving. I think at this point we can start wrist ROM activity at home and with OT. If pain not well controlled the next diagnostic step would be MRI arthrogram. Recheck in 3 weeks.   Orders Placed This Encounter  Procedures  . DG Wrist Complete Left    Standing Status:   Future    Standing Expiration Date:   10/11/2018    Order Specific Question:   Reason for Exam (SYMPTOM  OR DIAGNOSIS REQUIRED)    Answer:   eval fx    Order Specific Question:   Preferred imaging location?    [facility]    Order Specific Question:   Radiology Contrast Protocol - do NOT remove file path    Answer:   \\[email or web address]   No orders of the defined types were placed in this encounter.   Discussed warning signs or symptoms. Please see discharge instructions. Patient expresses understanding.

## 2017-09-02 ENCOUNTER — Encounter: Payer: Self-pay | Admitting: Family Medicine

## 2017-09-02 ENCOUNTER — Ambulatory Visit (INDEPENDENT_AMBULATORY_CARE_PROVIDER_SITE_OTHER): Payer: Worker's Compensation | Admitting: Family Medicine

## 2017-09-02 ENCOUNTER — Ambulatory Visit (INDEPENDENT_AMBULATORY_CARE_PROVIDER_SITE_OTHER): Payer: Worker's Compensation

## 2017-09-02 VITALS — BP 125/84 | HR 79 | Wt 210.0 lb

## 2017-09-02 DIAGNOSIS — S52592D Other fractures of lower end of left radius, subsequent encounter for closed fracture with routine healing: Secondary | ICD-10-CM

## 2017-09-02 DIAGNOSIS — S52592A Other fractures of lower end of left radius, initial encounter for closed fracture: Secondary | ICD-10-CM

## 2017-09-02 DIAGNOSIS — W19XXXD Unspecified fall, subsequent encounter: Secondary | ICD-10-CM

## 2017-09-02 NOTE — Progress Notes (Signed)
Mark Carson is a 62 y.o. male who presents to [facility] today for follow-up left wrist fracture. Mark Carson suffered an injury to his left wrist resulting in a distal radius fracture work in November. He has been immobilized since with delayed fracture healing. He also developed hand swelling and discomfort and was referred to hand therapy.  The hand therapy has been very helpful. He notes decreased pain and stiffness. He continues to have limited ROM especially with extension and supination.  He denies any new injury.  He notes continued but improved stiffness.  He notes he has been able to pick up a remote control and move very light weights at home.  He notes that he is completely unable to pick up or push heavy objects.  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   Past Medical History:  Diagnosis Date  . Hyperlipidemia   . Kidney stones    No past surgical history on file. Social History   Tobacco Use  . Smoking status: Never Smoker  . Smokeless tobacco: Never Used  Substance Use Topics  . Alcohol use: Yes    Alcohol/week: 3.0 oz    Types: 6 Standard drinks or equivalent per week    Comment: per week     ROS:  As above   Medications: No current outpatient medications on file.   No current facility-administered medications for this visit.    No Known Allergies   Exam:  BP 125/84   Pulse 79   Wt 210 lb (95.3 kg)   BMI 30.13 kg/m  General: Well Developed, well nourished, and in no acute distress.  Neuro/Psych: Alert and oriented x3, extra-ocular muscles intact, able to move all 4 extremities, sensation grossly intact. Skin: Warm and dry, no rashes noted.  Respiratory: Not using accessory muscles, speaking in full sentences, trachea midline.  Cardiovascular: Pulses palpable, no extremity edema. Abdomen: Does not appear  distended. MSK:  Left wrist and hand normal-appearing no swelling mildly tender to palpation dorsal distal radius.  Limited but improved range of motion of the wrist especially with flexion.  Patient lacks extension beyond about 5 degrees and supination about 20 degrees. Capillary refill pulses and sensation are intact.   X-ray left wrist shows continued slight fracture line at the distal radius with minimal callus formation. Awaiting formal radiology review X-ray images independently reviewed    No results found for this or any previous visit (from the past 48 hour(s)). No results found.    Assessment and Plan: 62 y.o. male with  Left distal radius fracture.  Clinically improving with hand therapy.  The fracture is still taking quite a long time to heal.  Since he has had improvement in the last 3 weeks it is reasonable to continue hand therapy.  My plan is that if he is not close to being able to return to work in 3 more weeks we will proceed with MRI arthrogram of the wrist to further delineate fracture healing and possible ligamentous injury.  Work note provided.  Recheck 3 weeks.  Orders Placed This Encounter  Procedures  . DG Wrist Complete Left    Standing Status:   Future    Number of Occurrences:   1    Standing Expiration Date:   11/01/2018    Order Specific Question:   Reason for Exam (SYMPTOM  OR DIAGNOSIS  REQUIRED)    Answer:   follow up fx    Order Specific Question:   Preferred imaging location?    [facility]    Order Specific Question:   Radiology Contrast Protocol - do NOT remove file path    Answer:   \\[email or web address]   No orders of the defined types were placed in this encounter.   Discussed warning signs or symptoms. Please see discharge instructions. Patient expresses understanding.

## 2017-09-02 NOTE — Patient Instructions (Signed)
Thank you for coming in today. Continue hand therapy.  Ok to start more aggressive therapy if not too painful.  Recheck in 3 weeks.

## 2017-09-07 ENCOUNTER — Telehealth: Payer: Self-pay

## 2017-09-07 NOTE — Telephone Encounter (Signed)
Chari ManningBob Krabes form [employer], the patient employer is requesting a call back from Dr. Denyse Amassorey in reference to patient being out of work due to his injury. He stated that he is unsure if patient will even return back to work. Please call employer on cell at [phone] or work phone [phone]. Ameliyah Sarno,CMA

## 2017-09-07 NOTE — Telephone Encounter (Signed)
I need permission from Mark Carson to speak with employer. Please call pt and make sure he is ok with me speaking with his boss.

## 2017-09-08 NOTE — Telephone Encounter (Signed)
Spoke to patient he stated that it is ok to talk to his employer. Please advise. Rhonda Cunningham,CMA

## 2017-09-08 NOTE — Telephone Encounter (Signed)
I called Mark ManningBob Carson back at his cell [phone] and left a message. He may return my call.

## 2017-09-09 NOTE — Telephone Encounter (Signed)
Patient supervisor called back and requested a call back from provider. Please advise. Rhonda Cunningham,CMA

## 2017-09-09 NOTE — Telephone Encounter (Signed)
With Mark Carson's permission I spoke with his boss in general about when he could expect to return to work. I summarized my last note that we would make a decision on March 20th. I stated that we could provide medical records with a record request and that a second opinion is warranted.

## 2017-09-17 ENCOUNTER — Telehealth: Payer: Self-pay | Admitting: Family Medicine

## 2017-09-17 NOTE — Telephone Encounter (Signed)
FYI Mark Carson called and states he is progressing well.

## 2017-09-17 NOTE — Telephone Encounter (Signed)
Occupational therapy notes received doing great but recommend extending therapy by 4 more weeks.  Note will be scanned and faxed back

## 2017-09-23 ENCOUNTER — Ambulatory Visit (INDEPENDENT_AMBULATORY_CARE_PROVIDER_SITE_OTHER): Payer: Worker's Compensation | Admitting: Family Medicine

## 2017-09-23 ENCOUNTER — Encounter: Payer: Self-pay | Admitting: Family Medicine

## 2017-09-23 ENCOUNTER — Ambulatory Visit (INDEPENDENT_AMBULATORY_CARE_PROVIDER_SITE_OTHER): Payer: Worker's Compensation

## 2017-09-23 VITALS — BP 123/78 | HR 72 | Wt 211.0 lb

## 2017-09-23 DIAGNOSIS — S52592G Other fractures of lower end of left radius, subsequent encounter for closed fracture with delayed healing: Secondary | ICD-10-CM

## 2017-09-23 DIAGNOSIS — S52592D Other fractures of lower end of left radius, subsequent encounter for closed fracture with routine healing: Secondary | ICD-10-CM | POA: Diagnosis not present

## 2017-09-23 DIAGNOSIS — W19XXXD Unspecified fall, subsequent encounter: Secondary | ICD-10-CM

## 2017-09-23 NOTE — Progress Notes (Signed)
Mark Carson is a 62 y.o. male who presents to [facility] today for follow-up left wrist fracture. Mark Carson suffered an injury to his left wrist resulting in a distal radius fracture work in November. He has been immobilized since with delayed fracture healing. He also developed hand swelling and discomfort and was referred to hand therapy.   The hand therapy has been very helpful. He notes decreased pain and stiffness. He continues to have limited ROM especially with extension and supination.  He denies any new injury.  He notes continued but improved stiffness.  With PT he notes that his left grip strength is about 25% compared to his right.  Additionally as part of his workers comp evaluation he had a second opinion with hand surgery who recommends hand therapy for 6 weeks with an estimated return to work time of about 6 weeks into the future in late April.   Mark Carson thinks that he will probably be able to go back to work sooner than that 4 weeks.  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  Past Medical History:  Diagnosis Date  . Hyperlipidemia   . Kidney stones    No past surgical history on file. Social History   Tobacco Use  . Smoking status: Never Smoker  . Smokeless tobacco: Never Used  Substance Use Topics  . Alcohol use: Yes    Alcohol/week: 3.0 oz    Types: 6 Standard drinks or equivalent per week    Comment: per week     ROS:  As above   Medications: No current outpatient medications on file.   No current facility-administered medications for this visit.    No Known Allergies   Exam:  BP 123/78   Pulse 72   Wt 211 lb (95.7 kg)   BMI 30.28 kg/m  General: Well Developed, well nourished, and in no acute distress.  Neuro/Psych: Alert and oriented x3,  extra-ocular muscles intact, able to move all 4 extremities, sensation grossly intact. Skin: Warm and dry, no rashes noted.  Respiratory: Not using accessory muscles, speaking in full sentences, trachea midline.  Cardiovascular: Pulses palpable, no extremity edema. Abdomen: Does not appear distended. MSK:  Left wrist and hand normal-appearing no swelling mildly tender to palpation dorsal distal radius.  Limited but improved range of motion of the wrist especially with flexion.  Patient lacks extension beyond about 15 degrees and lacks full supination by about 5-10 degrees. Capillary refill pulses and sensation are intact.   My independent review of the x-ray images of the left wrist shows continued slight fracture line at the distal radius with minimal callus formation.  Awaiting formal radiology review     Assessment and Plan: 62 y.o. male with  Left distal radius fracture.   Doing quite well with hand therapy.  He is not quite ready yet for 100% full return to work with no restrictions.  I estimate that it is very likely that within 4-6 weeks he will be able to return to work.  Plan to continue hand therapy and recheck in 3-4 weeks.  At that time we can make a determination about return to work. Mark Carson is significantly better today than he was at the last visit. Hand therapy notes will be scanned into the computer.  Orders Placed This Encounter  Procedures  . DG Wrist Complete Left    Standing Status:   Future    Number of Occurrences:   1    Standing Expiration Date:   11/24/2018    Order Specific Question:   Reason for Exam (SYMPTOM  OR DIAGNOSIS REQUIRED)    Answer:   follow up fracture    Order Specific Question:   Preferred imaging location?    Answer:   [facility]    Order Specific Question:   Radiology Contrast Protocol - do NOT remove file path    Answer:   [email or web address]   No orders of the defined types were placed in  this encounter.   Discussed warning signs or symptoms. Please see discharge instructions. Patient expresses understanding.  I spent 15 minutes with this patient, greater than 50% was face-to-face time counseling regarding ddx and treatment plan.

## 2017-09-23 NOTE — Patient Instructions (Addendum)
Thank you for coming in today. Continue hand therapy.  Recheck in 3-4 weeks. Likely we will make a determination about return to work then.  Keep sending hand therapy notes. Those are very helpful.

## 2017-10-20 ENCOUNTER — Encounter: Payer: Self-pay | Admitting: Family Medicine

## 2017-10-20 ENCOUNTER — Ambulatory Visit (INDEPENDENT_AMBULATORY_CARE_PROVIDER_SITE_OTHER): Payer: BLUE CROSS/BLUE SHIELD | Admitting: Family Medicine

## 2017-10-20 VITALS — BP 134/88 | HR 62 | Temp 98.0°F | Ht 70.0 in | Wt 207.0 lb

## 2017-10-20 DIAGNOSIS — S52592G Other fractures of lower end of left radius, subsequent encounter for closed fracture with delayed healing: Secondary | ICD-10-CM | POA: Diagnosis not present

## 2017-10-20 NOTE — Progress Notes (Signed)
   Mark CouncilJohn Carson is a 62 y.o. male who presents to [facility] today for follow-up left wrist fracture. Mark Carson suffered an injury to his left wrist resulting in a distal radius fracture work in November. He has been immobilized since with delayed fracture healing. He also developed hand swelling and discomfort and was referred to hand therapy.   The hand therapy has been very helpful. He notes decreased pain and stiffness. He continues to have limited ROM especially with extension and supination.  He denies any new injury.  He notes continued but improved stiffness.  With PT he notes that his left grip strength is 41% compared to his right.    Mark Carson thinks that he will probably be able to go back to work with light duties soon and full duty in about 2-4 weeks.   Past Medical History:  Diagnosis Date  . Hyperlipidemia   . Kidney stones    No past surgical history on file. Social History   Tobacco Use  . Smoking status: Never Smoker  . Smokeless tobacco: Never Used  Substance Use Topics  . Alcohol use: Yes    Alcohol/week: 3.0 oz    Types: 6 Standard drinks or equivalent per week    Comment: per week     ROS:  As above   Medications: No current outpatient medications on file.   No current facility-administered medications for this visit.    No Known Allergies   Exam:  BP 134/88   Pulse 62   Temp 98 F (36.7 C) (Oral)   Ht 5\' 10"  (1.778 m)   Wt 207 lb (93.9 kg)   BMI 29.70 kg/m  General: Well Developed, well nourished, and in no acute distress.  Neuro/Psych: Alert and oriented x3, extra-ocular muscles intact, able to move all 4 extremities, sensation grossly intact. Skin: Warm and dry, no rashes noted.  Respiratory: Not using accessory muscles, speaking in full sentences, trachea midline.  Cardiovascular: Pulses palpable, no extremity edema. Abdomen: Does not appear distended. MSK:  Left wrist and hand normal-appearing no  swelling mildly tender to palpation dorsal distal radius.  Limited but improved range of motion of the wrist especially with flexion.  Patient lacks extension beyond about 20degrees and lacks full supination by about 10 degrees. Capillary refill pulses and sensation are intact. Grip strength left about 1/2 compared to right with finger squeeze.       Assessment and Plan: 62 y.o. male with  Left distal radius fracture.   Doing well. Plan for return to work with light duties now and return with full duties anticipated no later than 1 month.  Recheck in 1 month.  Continue home OT exercises.   No orders of the defined types were placed in this encounter.  No orders of the defined types were placed in this encounter.   Discussed warning signs or symptoms. Please see discharge instructions. Patient expresses understanding.  I spent 15 minutes with this patient, greater than 50% was face-to-face time counseling regarding ddx and treatment plan.

## 2017-10-20 NOTE — Patient Instructions (Signed)
Thank you for coming in today. Continue home PT.  Recheck in 1 month.  Return sooner if needed.  Let me know if you want me to modify work note.

## 2017-11-10 ENCOUNTER — Ambulatory Visit (INDEPENDENT_AMBULATORY_CARE_PROVIDER_SITE_OTHER): Payer: Worker's Compensation | Admitting: Family Medicine

## 2017-11-10 ENCOUNTER — Encounter: Payer: Self-pay | Admitting: Family Medicine

## 2017-11-10 ENCOUNTER — Telehealth: Payer: Self-pay | Admitting: Family Medicine

## 2017-11-10 VITALS — BP 123/77 | HR 60 | Ht 70.0 in | Wt 209.0 lb

## 2017-11-10 DIAGNOSIS — Z789 Other specified health status: Secondary | ICD-10-CM

## 2017-11-10 DIAGNOSIS — S52592G Other fractures of lower end of left radius, subsequent encounter for closed fracture with delayed healing: Secondary | ICD-10-CM

## 2017-11-10 NOTE — Progress Notes (Signed)
       Mark Carson is a 62 y.o. male who presents to [facility] today for follow up.   Patient suffered a left wrist injury resulting a distal radius fracture in November 2018. With immobilization and hand therapy, he is finally feeling back to his baseline in terms of strength and mobility. Today he is reporting no pain and no deficits in movements. He feels his ROM is now normal and his grip strength is 90% of his right hand. He is still performing hand therapy exercises on his own.   He feels ready to return to work 100%.    Past Medical History:  Diagnosis Date  . Hyperlipidemia   . Kidney stones    No past surgical history on file. Social History   Tobacco Use  . Smoking status: Never Smoker  . Smokeless tobacco: Never Used  Substance Use Topics  . Alcohol use: Yes    Alcohol/week: 3.0 oz    Types: 6 Standard drinks or equivalent per week    Comment: per week   family history includes Hypertension in his mother.  ROS as above:  Medications: No current outpatient medications on file.   No current facility-administered medications for this visit.    No Known Allergies  Health Maintenance Health Maintenance  Topic Date Due  . Hepatitis C Screening  [birth year]  . INFLUENZA VACCINE  05/26/2018 (Originally 02/04/2018)  . HIV Screening  08/06/2026 (Originally 01/11/1971)  . COLONOSCOPY  07/07/2018  . TETANUS/TDAP  07/07/2018     Exam:  BP 123/77   Pulse 60   Ht  (1.778 m)   Wt 209 lb (94.8 kg)   BMI 29.99 kg/m  Gen: Well NAD HEENT: EOMI,  MMM Lungs: Normal work of breathing. CTABL Heart: RRR no MRG Abd: NABS, Soft. Nondistended, Nontender Exts: Brisk capillary refill, warm and well perfused.   MSK: Left wrist is normal appearing with no sweling or erythema.  No tenderness to palpation ROM with flexion and extension equal bilaterally for  both wrists.  Left hand 75% strength compared to right Capillary refill and pulses are intact    No results found for this or any previous visit (from the past 72 hour(s)). No results found.    Assessment and Plan: 62 y.o. male with   Left wrist:   Patient reached maximum medical improvement and and he is ready to return to full work duties. We discussed that he may never have exactly equal grip strength in both hands, but the strength will likely be close enough for there to be no obvious detriments to his life. We do not need to follow up at this point.    No orders of the defined types were placed in this encounter.  No orders of the defined types were placed in this encounter.    Discussed warning signs or symptoms. Please see discharge instructions. Patient expresses understanding.  I spent 15 minutes with this patient, greater than 50% was face-to-face time counseling regarding ddx and treatment plan.

## 2017-11-10 NOTE — Telephone Encounter (Signed)
Patient cannot recall if he has measles, mumps or rubella as a child.  Will check titers.

## 2017-11-10 NOTE — Patient Instructions (Signed)
Thank you for coming in today. Recheck as needed.    

## 2017-11-11 LAB — MEASLES/MUMPS/RUBELLA IMMUNITY
MUMPS IGG: 15.4 [AU]/ml
Rubella: 7.5 index

## 2017-11-30 ENCOUNTER — Emergency Department (INDEPENDENT_AMBULATORY_CARE_PROVIDER_SITE_OTHER)
Admission: EM | Admit: 2017-11-30 | Discharge: 2017-11-30 | Disposition: A | Discharge status: Home / Self Care | Payer: BLUE CROSS/BLUE SHIELD | Source: Home / Self Care

## 2017-11-30 ENCOUNTER — Other Ambulatory Visit: Payer: Self-pay

## 2017-11-30 DIAGNOSIS — L03116 Cellulitis of left lower limb: Secondary | ICD-10-CM | POA: Diagnosis not present

## 2017-11-30 MED ORDER — DOXYCYCLINE HYCLATE 100 MG PO TABS: 100.0000 mg | ORAL_TABLET | Freq: Two times a day (BID) | ORAL | 0 refills | Status: DC

## 2017-11-30 NOTE — ED Triage Notes (Signed)
Pt has a bite on left medial calf. It is approximately 3" in diameter, redness noted.  Noticed it about Friday or Saturday.  Does not remember a bite.

## 2017-11-30 NOTE — Discharge Instructions (Signed)
Gently wash the area twice daily with soap and water for a week.

## 2017-11-30 NOTE — ED Provider Notes (Signed)
[facility]   [id number] 11/30/17 Arrival Time: 1157   SUBJECTIVE:  Mark Carson is a 62 y.o. male who presents to the urgent care with complaint of three inch area of infection on left calf which began 2 to 3 days ago.  Unknown etiology.  He has been using rubbing alcohol which is soothing.  Some itching, no significant pain.   Past Medical History:  Diagnosis Date  . Hyperlipidemia   . Kidney stones    Family History  Problem Relation Age of Onset  . Hypertension Mother    Social History   Socioeconomic History  . Marital status: Divorced    Spouse name: Not on file  . Number of children: Not on file  . Years of education: Not on file  . Highest education level: Not on file  Occupational History  . Not on file  Social Needs  . Financial resource strain: Not on file  . Food insecurity:    Worry: Not on file    Inability: Not on file  . Transportation needs:    Medical: Not on file    Non-medical: Not on file  Tobacco Use  . Smoking status: Never Smoker  . Smokeless tobacco: Never Used  Substance and Sexual Activity  . Alcohol use: Yes    Alcohol/week: 3.0 oz    Types: 6 Standard drinks or equivalent per week    Comment: per week  . Drug use: No  . Sexual activity: Not on file  Lifestyle  . Physical activity:    Days per week: Not on file    Minutes per session: Not on file  . Stress: Not on file  Relationships  . Social connections:    Talks on phone: Not on file    Gets together: Not on file    Attends religious service: Not on file    Active member of club or organization: Not on file    Attends meetings of clubs or organizations: Not on file    Relationship status: Not on file  . Intimate partner violence:    Fear of current or ex partner: Not on file    Emotionally abused: Not on file    Physically abused: Not on file    Forced sexual activity: Not on file  Other Topics Concern  . Not on file  Social History Narrative  . Not on  file   No outpatient medications have been marked as taking for the 11/30/17 encounter [facility] Encounter).   No Known Allergies    ROS: As per HPI, remainder of ROS negative.   OBJECTIVE:   Vitals:   11/30/17 1218 11/30/17 1220  BP: 111/73   Pulse: 66   Temp: 98 F (36.7 C)   TempSrc: Oral   SpO2: 99%   Weight:  202 lb (91.6 kg)  Height:   (1.778 m)     General appearance: alert; no distress Eyes: PERRL; EOMI; conjunctiva normal HENT: normocephalic; atraumatic; ; oral mucosa normal Neck: supple Extremities: no cyanosis or edema; symmetrical with no gross deformities Skin: warm and dry with vesicular scaly 2 cm area medial left calf and 5 inch surrounding erythema with mild induration, no fluctuance, as well as several erythematous satellite areas of erythema. Neurologic: normal gait; grossly normal Psychological: alert and cooperative; normal mood and affect      Labs:  Results for orders placed or performed in visit on 11/10/17  Measles/Mumps/Rubella Immunity  Result Value Ref Range   Rubeola IgG >300.00  AU/mL   Mumps IgG 15.40 AU/mL   Rubella 7.50 index    Labs Reviewed - No data to display  No results found.     ASSESSMENT & PLAN:  1. Cellulitis of left lower extremity   Gently wash the area twice daily with soap and water for a week.   Meds ordered this encounter  Medications  . doxycycline (VIBRA-TABS) 100 MG tablet    Sig: Take 1 tablet (100 mg total) by mouth 2 (two) times daily.    Dispense:  14 tablet    Refill:  0    Reviewed expectations re: course of current medical issues. Questions answered. Outlined signs and symptoms indicating need for more acute intervention. Patient verbalized understanding. After Visit Summary given.      Elvina Sidle, MD 11/30/17 1231

## 2017-12-01 ENCOUNTER — Telehealth: Payer: Self-pay

## 2017-12-01 NOTE — Telephone Encounter (Signed)
t called and left a VM that the Doxy was upsetting his stomach.  Spoke with Dr Cleta Alberts, and called in Keflex 500 Mg tid #21 no refills.  Called in to [place], [place].

## 2017-12-10 ENCOUNTER — Telehealth: Payer: Self-pay | Admitting: *Deleted

## 2017-12-10 NOTE — Telephone Encounter (Signed)
Pt called reports that his insect bite is healing, but still has some redness at the center of the bite. He is requesting few more days of Keflex. Please advise.

## 2017-12-10 NOTE — Telephone Encounter (Signed)
It appears initial provided wanted pt to be on doxycycline (like to possible coverage of tick bite given time of year) however pt was changed to keflex due to stomach upset.  He may need a different antibiotic such as Bactrim if symptoms not cleared.  Please encourage pt to be re-evaluated by PCP or return to UC for recheck so appropriate treatment can be prescribed.

## 2017-12-10 NOTE — Telephone Encounter (Signed)
Pt advised of [place],PA note.

## 2018-02-22 ENCOUNTER — Encounter: Payer: Self-pay | Admitting: Family Medicine

## 2018-02-22 ENCOUNTER — Ambulatory Visit (INDEPENDENT_AMBULATORY_CARE_PROVIDER_SITE_OTHER): Payer: 59 | Admitting: Family Medicine

## 2018-02-22 VITALS — BP 122/84 | HR 57 | Temp 98.4°F | Wt 194.0 lb

## 2018-02-22 DIAGNOSIS — J029 Acute pharyngitis, unspecified: Secondary | ICD-10-CM | POA: Diagnosis not present

## 2018-02-22 DIAGNOSIS — R197 Diarrhea, unspecified: Secondary | ICD-10-CM | POA: Diagnosis not present

## 2018-02-22 DIAGNOSIS — R110 Nausea: Secondary | ICD-10-CM

## 2018-02-22 LAB — POCT RAPID STREP A (OFFICE): RAPID STREP A SCREEN: NEGATIVE

## 2018-02-22 NOTE — Progress Notes (Signed)
Subjective:    Patient ID: Mark Carson, male    DOB: 01/15/1956, 62 y.o.   MRN: [id number]  HPI 62 yo male c/o of right ear pain x 1 week going down into his throat. Painful to swallow.  Taking Benadryl. Has been sweating at night but no known fever.  No nasal congestion cough etc.  Right sided sore throat and right ear pain-exam is completely normal today.  No worsening or alleviating factors.  He also c/o of nausea only. No vomiting.  has had some episodes of diarrhea last episode was saturday he has been taking immodium  He say his stomach is not back to normal but it is better than it was last week.  No blood in the stool or urine.  He is now off of Imodium and has not had any more diarrhea.  No Worsening or alleviating factors.   Review of Systems  BP 122/84   Pulse (!) 57   Temp 98.4 F (36.9 C)   Wt 194 lb (88 kg)   SpO2 100%   BMI 27.84 kg/m     No Known Allergies  Past Medical History:  Diagnosis Date  . Hyperlipidemia   . Kidney stones     No past surgical history on file.  Social History   Socioeconomic History  . Marital status: Divorced    Spouse name: Not on file  . Number of children: Not on file  . Years of education: Not on file  . Highest education level: Not on file  Occupational History  . Not on file  Social Needs  . Financial resource strain: Not on file  . Food insecurity:    Worry: Not on file    Inability: Not on file  . Transportation needs:    Medical: Not on file    Non-medical: Not on file  Tobacco Use  . Smoking status: Never Smoker  . Smokeless tobacco: Never Used  Substance and Sexual Activity  . Alcohol use: Yes    Alcohol/week: 6.0 standard drinks    Types: 6 Standard drinks or equivalent per week    Comment: per week  . Drug use: No  . Sexual activity: Not on file  Lifestyle  . Physical activity:    Days per week: Not on file    Minutes per session: Not on file  . Stress: Not on file  Relationships  . Social  connections:    Talks on phone: Not on file    Gets together: Not on file    Attends religious service: Not on file    Active member of club or organization: Not on file    Attends meetings of clubs or organizations: Not on file    Relationship status: Not on file  . Intimate partner violence:    Fear of current or ex partner: Not on file    Emotionally abused: Not on file    Physically abused: Not on file    Forced sexual activity: Not on file  Other Topics Concern  . Not on file  Social History Narrative  . Not on file    Family History  Problem Relation Age of Onset  . Hypertension Mother     Outpatient Encounter Medications as of 02/22/2018  Medication Sig  . [DISCONTINUED] doxycycline (VIBRA-TABS) 100 MG tablet Take 1 tablet (100 mg total) by mouth 2 (two) times daily.   No facility-administered encounter medications on file as of 02/22/2018.  Objective:   Physical Exam  Constitutional: He is oriented to person, place, and time. He appears well-developed and well-nourished.  HENT:  Head: Normocephalic and atraumatic.  Right Ear: External ear normal.  Left Ear: External ear normal.  Nose: Nose normal.  Mouth/Throat: Oropharynx is clear and moist.  TMs and canals are clear.   Eyes: Pupils are equal, round, and reactive to light. Conjunctivae and EOM are normal.  Neck: Neck supple. No thyromegaly present.  Cardiovascular: Normal rate and normal heart sounds.  Pulmonary/Chest: Effort normal and breath sounds normal.  Abdominal: Soft. Bowel sounds are normal. He exhibits no distension and no mass. There is no tenderness. There is no rebound and no guarding. No hernia.  Lymphadenopathy:    He has no cervical adenopathy.  Neurological: He is alert and oriented to person, place, and time.  Skin: Skin is warm and dry.  Psychiatric: He has a normal mood and affect.        Assessment & Plan:  Right sided sore throat and ear pain-exam is completely normal.   Rapid strep was negative.  Gave him reassurance that could be viral.  If not improving by the end of the week please give us call back.  If he has another night with night sweats and please let us know.  Salt water gargles and lozenges for comfort.  Nausea with diarrhea-overall he is feeling better he is just not quite back to baseline.  He is been able to discontinue the Imodium which is reassuring no red flag symptoms.  Normal abdominal exam and no blood in the stool.  Again if symptoms recur please let us know.

## 2018-02-23 ENCOUNTER — Ambulatory Visit: Payer: Self-pay | Admitting: Family Medicine

## 2018-05-27 ENCOUNTER — Ambulatory Visit: Payer: Self-pay | Admitting: Family Medicine

## 2018-06-08 ENCOUNTER — Encounter: Payer: Self-pay | Admitting: Family Medicine

## 2018-06-08 ENCOUNTER — Ambulatory Visit (INDEPENDENT_AMBULATORY_CARE_PROVIDER_SITE_OTHER): Payer: Worker's Compensation | Admitting: Family Medicine

## 2018-06-08 VITALS — BP 120/64 | HR 66 | Ht 70.0 in | Wt 195.0 lb

## 2018-06-08 DIAGNOSIS — R2232 Localized swelling, mass and lump, left upper limb: Secondary | ICD-10-CM | POA: Diagnosis not present

## 2018-06-08 DIAGNOSIS — M720 Palmar fascial fibromatosis [Dupuytren]: Secondary | ICD-10-CM | POA: Insufficient documentation

## 2018-06-08 NOTE — Patient Instructions (Addendum)
Thank you for coming in today. I think this is Dupuytren Contracture.   I think it is probably unrelated to the injury.  Work on hand exercises including extending the palm and fingers.  Let me know if worsening.  We may consider injection or surgery.    Dupuytren Contracture Dupuytren contracture is a condition in which tissue under the skin of the palm becomes abnormally thickened. This causes one or more of the fingers to curl inward (contract) toward the palm. Eventually, the fingers may not be able to straighten out. This condition affects some or all of the fingers and the palm of the hand. It is often passed along from parent to child (inherited). Dupuytren contracture is a long-term (chronic) condition that develops (progresses) slowly over time. There is no cure, but symptoms can be managed and progression can be slowed with treatment. This condition is usually not dangerous or painful, but it can interfere with everyday tasks. What are the causes? This condition is caused by tissue (fascia) in the palm getting thicker and tighter. When the fascia thickens, it pulls on the cords of tissue (tendons) that control finger movement. This causes the fingers to contract. The cause of fascia thickening is not known. What increases the risk? This condition may be more likely to develop in:  People who are age 61 or older.  Men.  People with a family history of this condition.  People who use tobacco products, including cigarettes, chewing tobacco, and e-cigarettes.  People who drink alcohol excessively.  People with diabetes.  People with autoimmune diseases, such as HIV.  People with seizure disorders.  What are the signs or symptoms? Symptoms may develop in one or both hands. Any of the fingers can contract. The fingers farthest from the thumb are commonly affected. Usually, this condition is painless. You may have discomfort when holding or grabbing objects. Early symptoms of  this condition may include:  Thick, puckered skin on the hand.  One or more lumps (nodules) on the palm. Nodules may be tender when they first appear, but they are generally painless.  Symptoms of this condition develop slowly over months or years. Later symptoms of this condition may include:  Thick cords of tissue in the palm.  Fingers curled up toward the palm.  Inability to straighten the fingers into their normal position.  How is this diagnosed? This condition is diagnosed with a physical exam, which may include:  Looking at your hands and feeling your hands. This is to check for thickened fascia and nodules.  Measuring finger motion.  Doing the [employer] Test. You may be asked to try to put your hand on a surface, with your palm down and your fingers straight out.  How is this treated? There is no cure for this condition, but treatment can make symptoms more manageable and relieve discomfort. Treatment options may include:  Physical therapy. This can strengthen your hand and increase flexibility.  Occupational therapy. This can help you with everyday tasks that may be more difficult because of your condition.  A hand splint.  Shots (injections). Substances may be injected into your hand, such as: ? Medicines that help to decrease swelling (corticosteroids). ? Proteins (collagenase) to weaken thick tissue. After a collagenase injection, your health care provider may stretch your fingers.  Needle aponeurotomy. In this procedure, a needle is pushed through the skin and into the fascia. Moving the needle against the fascia can weaken or break up the thick tissue.  Surgery.  This may be needed if your condition causes discomfort or interferes with everyday activities. Physical therapy is usually needed after surgery.  In some cases, symptoms never develop to the point of needing major treatment, and caring for yourself at home can be enough to manage your condition.  Symptoms often return after treatment. Follow these instructions at home: If you have a splint:  Do not put pressure on any part of the splint until it is fully hardened. This may take several hours.  Wear the splint as told by your health care provider. Remove it only as told by your health care provider.  Loosen the splint if your fingers tingle, become numb, or turn cold and blue.  Do not let your splint get wet if it is not waterproof. ? If your splint is not waterproof, cover it with a watertight covering when you take a bath or a shower. ? Do not take baths, swim, or use a hot tub until your health care provider approves. Ask your health care provider if you can take showers. You may only be allowed to take sponge baths for bathing.  Keep the splint clean.  Ask your health care provider when it is safe to drive. Hand Care  Take these actions to help protect your hand from possible injury: ? Use tools that have padded grips. ? Wear protective gloves while you work with your hands. ? Avoid repetitive hand movements.  Avoid actions that cause pain or discomfort.  Stretch your hand by gently pulling your fingers backward toward your wrist. Do this as often as is comfortable. Stop if this causes pain.  Gently massage your hand as often as is comfortable.  If directed, apply heat to the affected area as often as told by your health care provider. Use the heat source that your health care provider recommends, such as a moist heat pack or a heating pad. ? Place a towel between your skin and the heat source. ? Leave the heat on for 20-30 minutes. ? Remove the heat if your skin turns bright red. This is especially important if you are unable to feel pain, heat, or cold. You may have a greater risk of getting burned. General instructions  Take over-the-counter and prescription medicines only as told by your health care provider.  Manage any other conditions that you have, such as  diabetes.  If physical therapy was prescribed, do exercises as told by your health care provider.  Keep all follow-up visits as told by your health care provider. This is important. Contact a health care provider if:  You develop new symptoms, or your symptoms get worse.  You have pain that gets worse or does not get better with medicine.  You have difficulty or discomfort with everyday tasks.  You have problems with your splint.  You develop numbness or tingling. Get help right away if:  You have severe pain.  Your fingers change color or become unusually cold. This information is not intended to replace advice given to you by your health care provider. Make sure you discuss any questions you have with your health care provider. Document Released: 04/20/2009 Document Revised: 08/07/2015 Document Reviewed: 11/15/2014 Elsevier Interactive Patient Education  [employer].

## 2018-06-08 NOTE — Progress Notes (Signed)
   Mark CouncilJohn Carson is a 62 y.o. male who presents to [facility] today for left hand nodule.  Mark RuizJohn was seen multiple times starting from November 2018 until May 2019 for left wrist fracture.  He had a prolonged healing process but ultimately had pretty good resolution.  Notes his wrist is been doing quite well but he is developed a slight nodule and contracture of his left palm.  He denies any recent injury aside from the wrist fracture about a year ago.  He notes he still able to function and notes that is not particularly painful.  He wonders if it is related to his injury November 2018.  He is not had any treatment for it yet.  He denies any personal or family history of Dupuytren's contracture.    ROS:  As above  Exam:  BP 120/64   Pulse 66   Ht 5\' 10"  (1.778 m)   Wt 195 lb (88.5 kg)   BMI 27.98 kg/m  General: Well Developed, well nourished, and in no acute distress.  Neuro/Psych: Alert and oriented x3, extra-ocular muscles intact, able to move all 4 extremities, sensation grossly intact. Skin: Warm and dry, no rashes noted.  Respiratory: Not using accessory muscles, speaking in full sentences, trachea midline.  Cardiovascular: Pulses palpable, no extremity edema. Abdomen: Does not appear distended. MSK: Left wrist relatively normal-appearing with slightly limited range of motion not particularly tender. Left hand slight contracture nodule at fourth metacarpal consistent with mild Dupuytren's contracture.  Right hand: Very slight palpable nodule fourth metacarpal again consistent with very early very mild Dupuytren's contracture.    Lab and Radiology Results No results found for this or any previous visit (from the past 72 hour(s)). No results found.     Assessment and Plan: 62 y.o. male with palmar nodules are very likely Dupuytren's contracture based on appearance and location.  Patient does not have a family history of this however.   I do not think the wrist fracture is related to Dupuytren's contracture however if not impossible.  Discussed options including watchful waiting and limited home exercise program.  If worsening patient will contact me or return to clinic.  Could consider steroid injections.    Historical information moved to improve visibility of documentation.  Past Medical History:  Diagnosis Date  . Hyperlipidemia   . Kidney stones    No past surgical history on file. Social History   Tobacco Use  . Smoking status: Never Smoker  . Smokeless tobacco: Never Used  Substance Use Topics  . Alcohol use: Yes    Alcohol/week: 6.0 standard drinks    Types: 6 Standard drinks or equivalent per week    Comment: per week   family history includes Hypertension in his mother.  Medications: No current outpatient medications on file.   No current facility-administered medications for this visit.    No Known Allergies    Discussed warning signs or symptoms. Please see discharge instructions. Patient expresses understanding.

## 2019-05-11 ENCOUNTER — Encounter: Payer: Self-pay | Admitting: Physician Assistant

## 2019-07-21 IMAGING — DX DG WRIST COMPLETE 3+V*L*
4 series · 4 of 4 positions shown · non-contrast
Comparison: 07/22/2017

CLINICAL DATA: History of wrist fracture.  Recheck

EXAM:
LEFT WRIST - COMPLETE 3+ VIEW

[wrist pa]
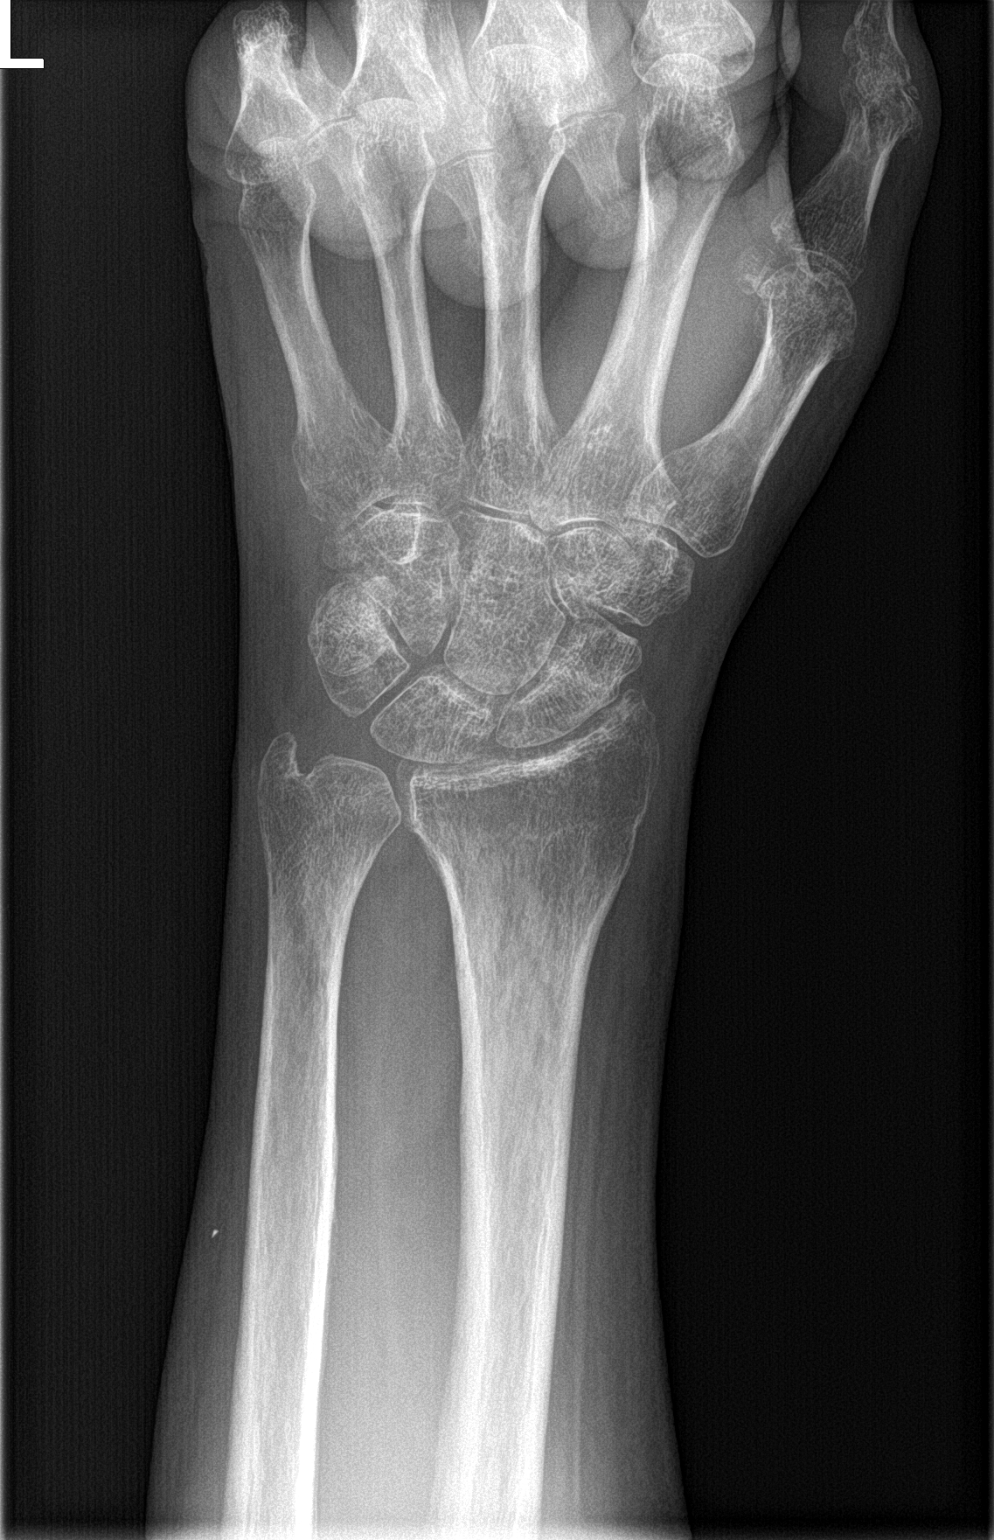

[wrist obl]
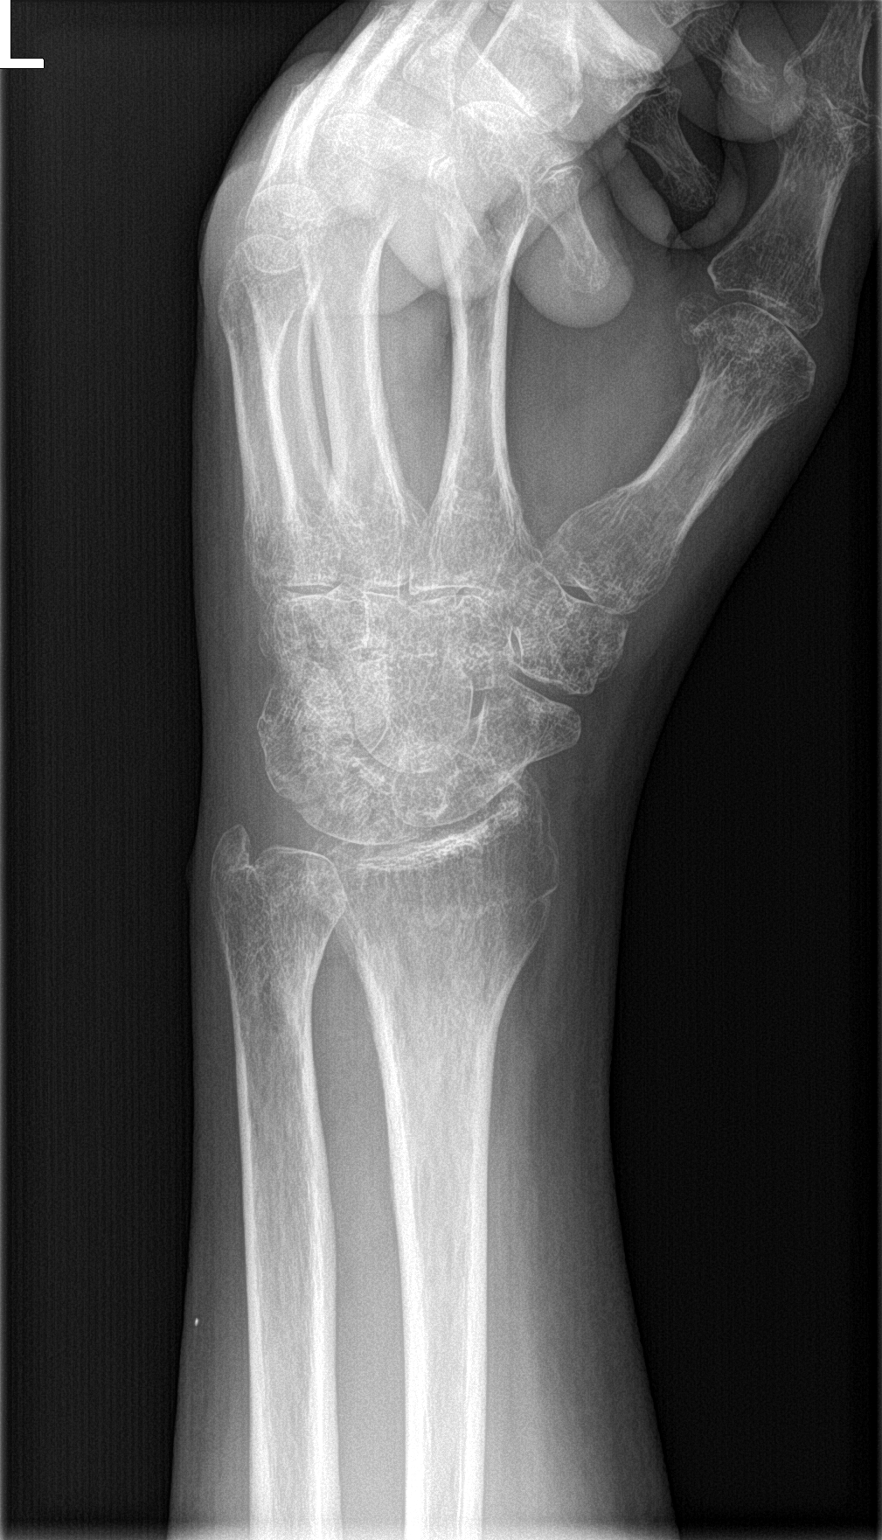

[wrist lat]
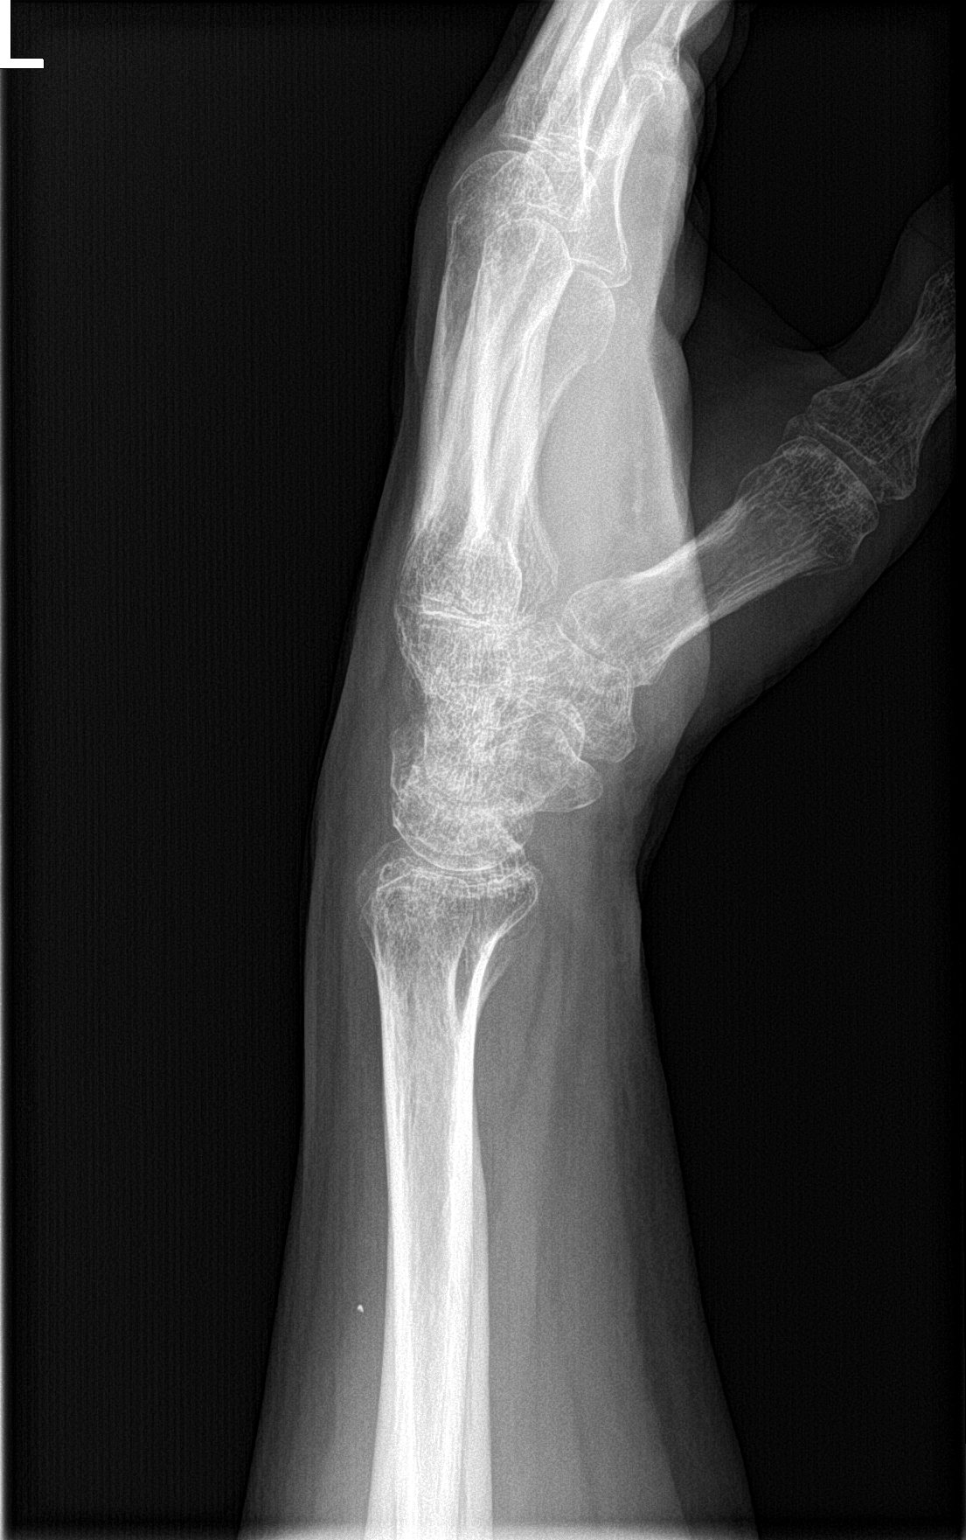

[wrist navicular]
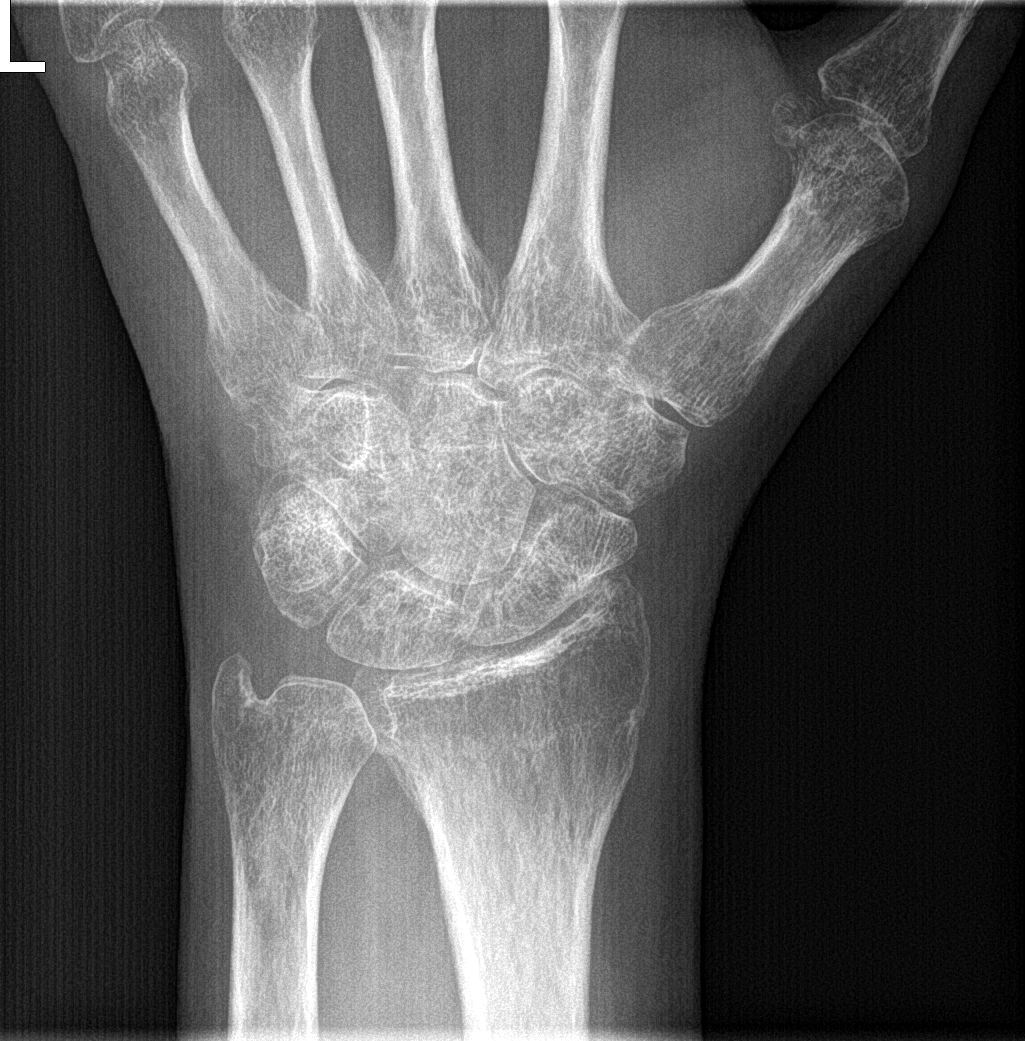

[4 of 4 positions shown; findings below may reference images not displayed]

FINDINGS: Sclerotic linear density across the distal left radial metaphysis
compatible with old fracture. The lucent fracture is not as well
visualized as on prior study but likely remains present in some
areas. No new fracture.
IMPRESSION: Partial healing of the distal left radial fracture. Fracture line
remains partially evident.

## 2019-08-11 IMAGING — DX DG WRIST COMPLETE 3+V*L*
4 series · 4 of 4 positions shown · non-contrast
Comparison: 08/12/2017, 07/22/2017

CLINICAL DATA: Status post fall 05/27/2017.

EXAM:
LEFT WRIST - COMPLETE 3+ VIEW

[wrist pa]
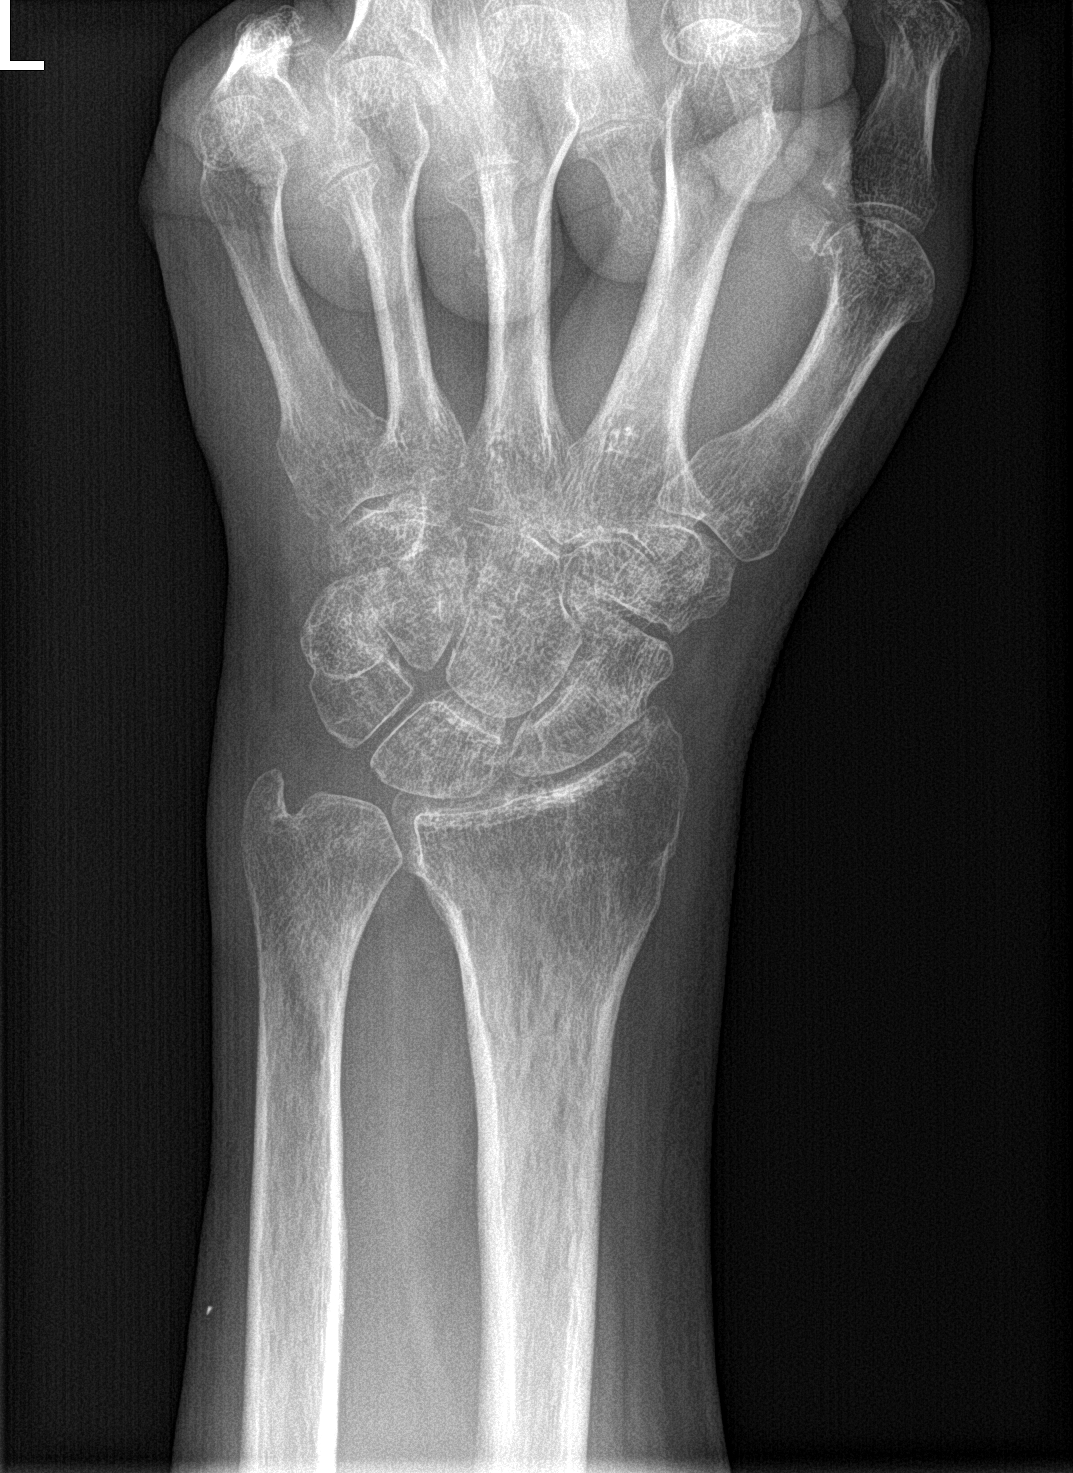

[wrist obl]
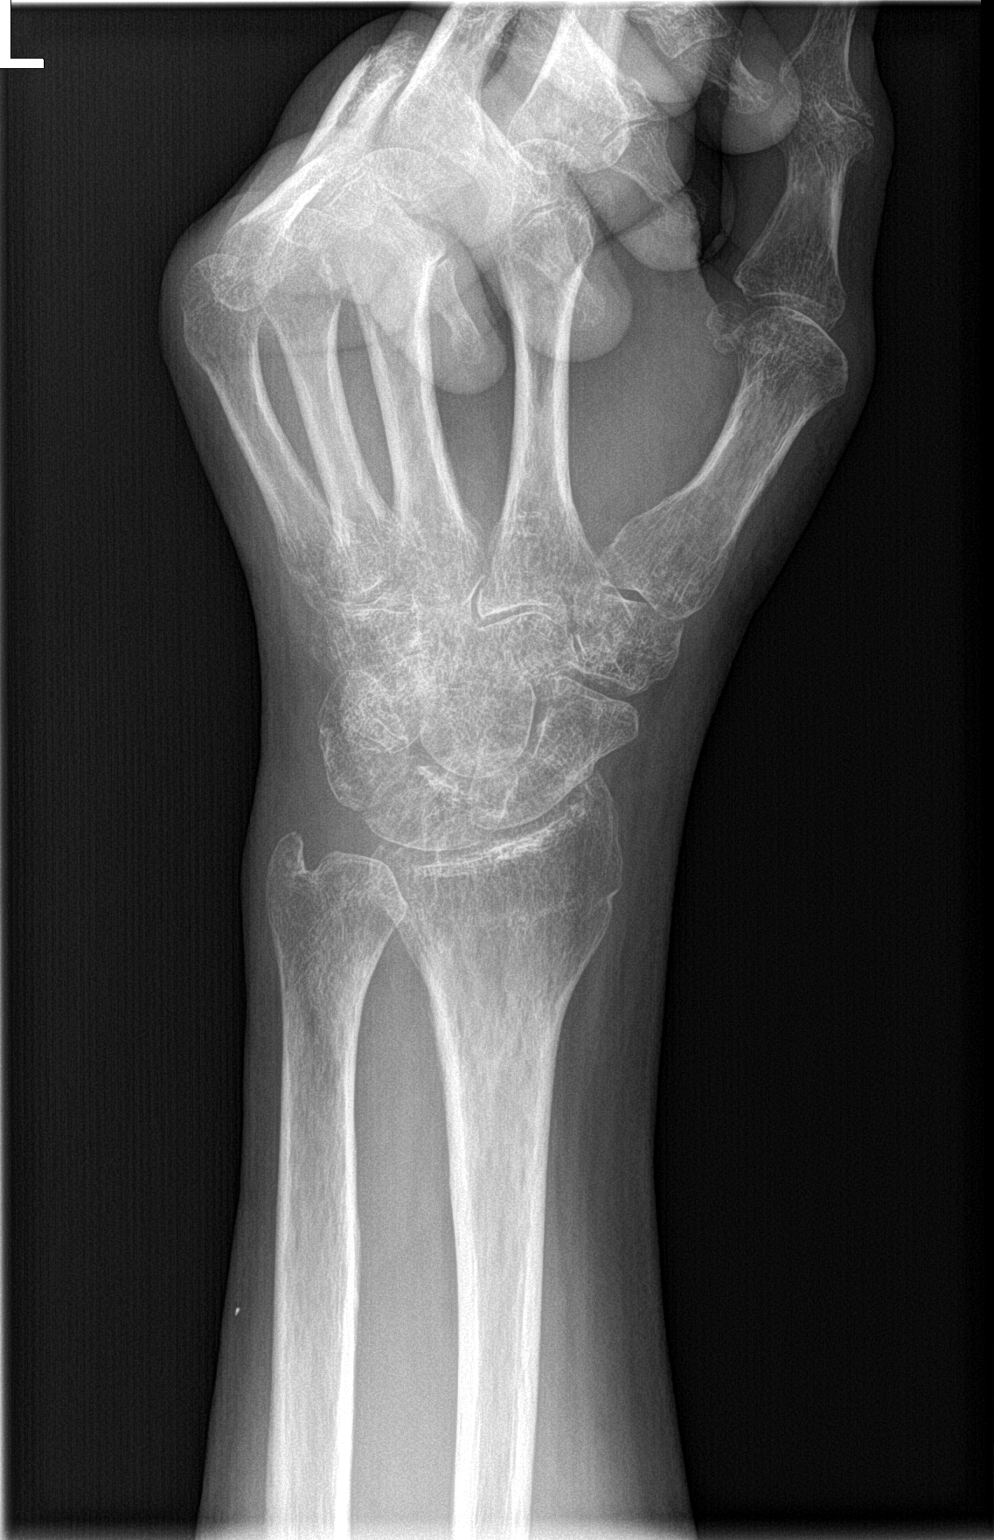

[wrist lat]
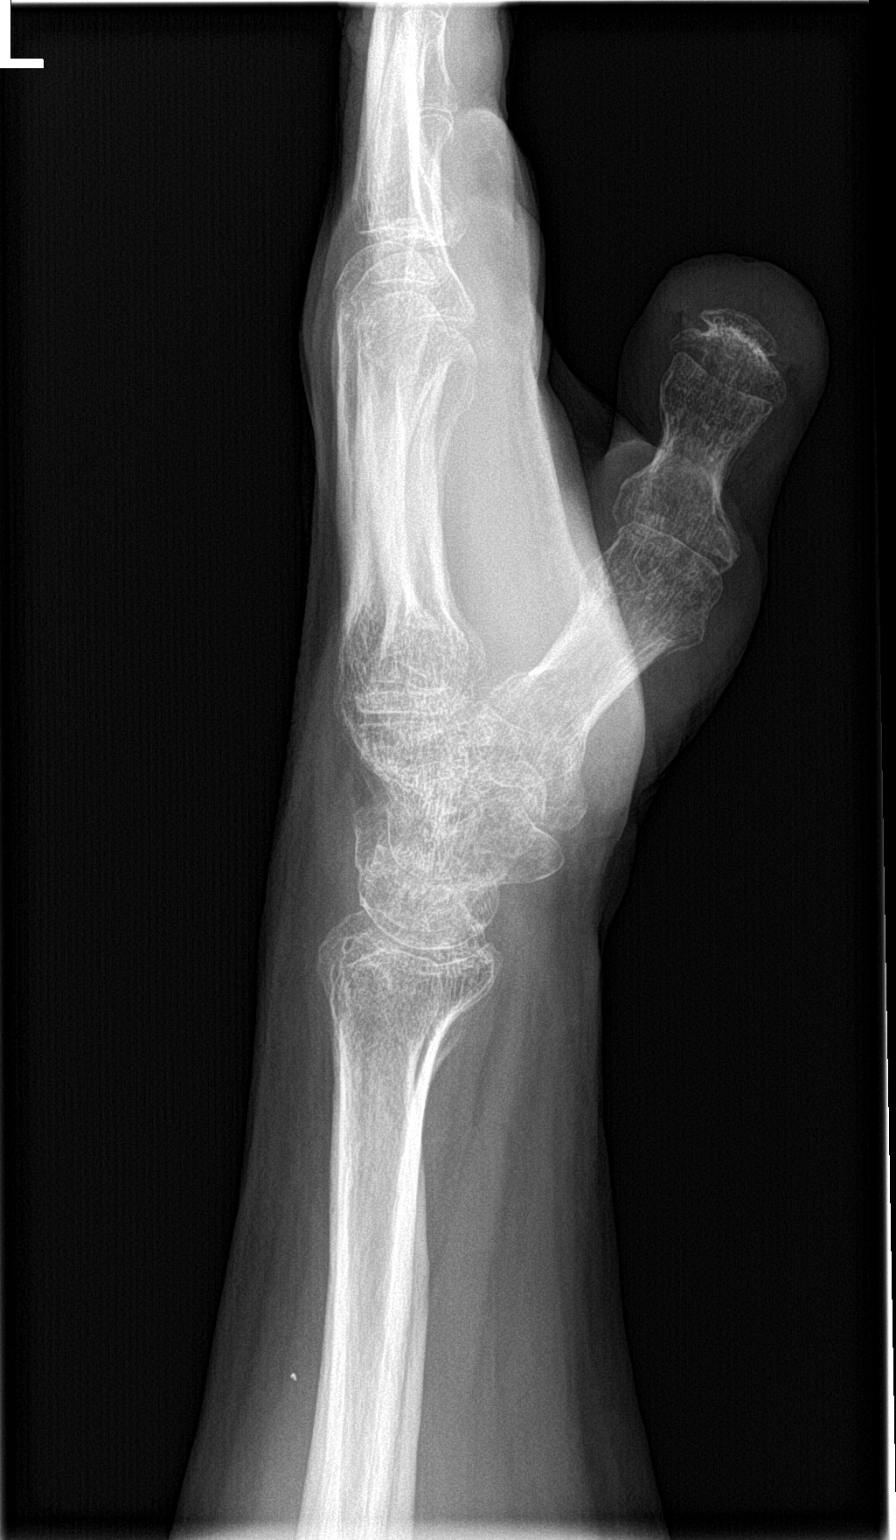

[wrist navicular]
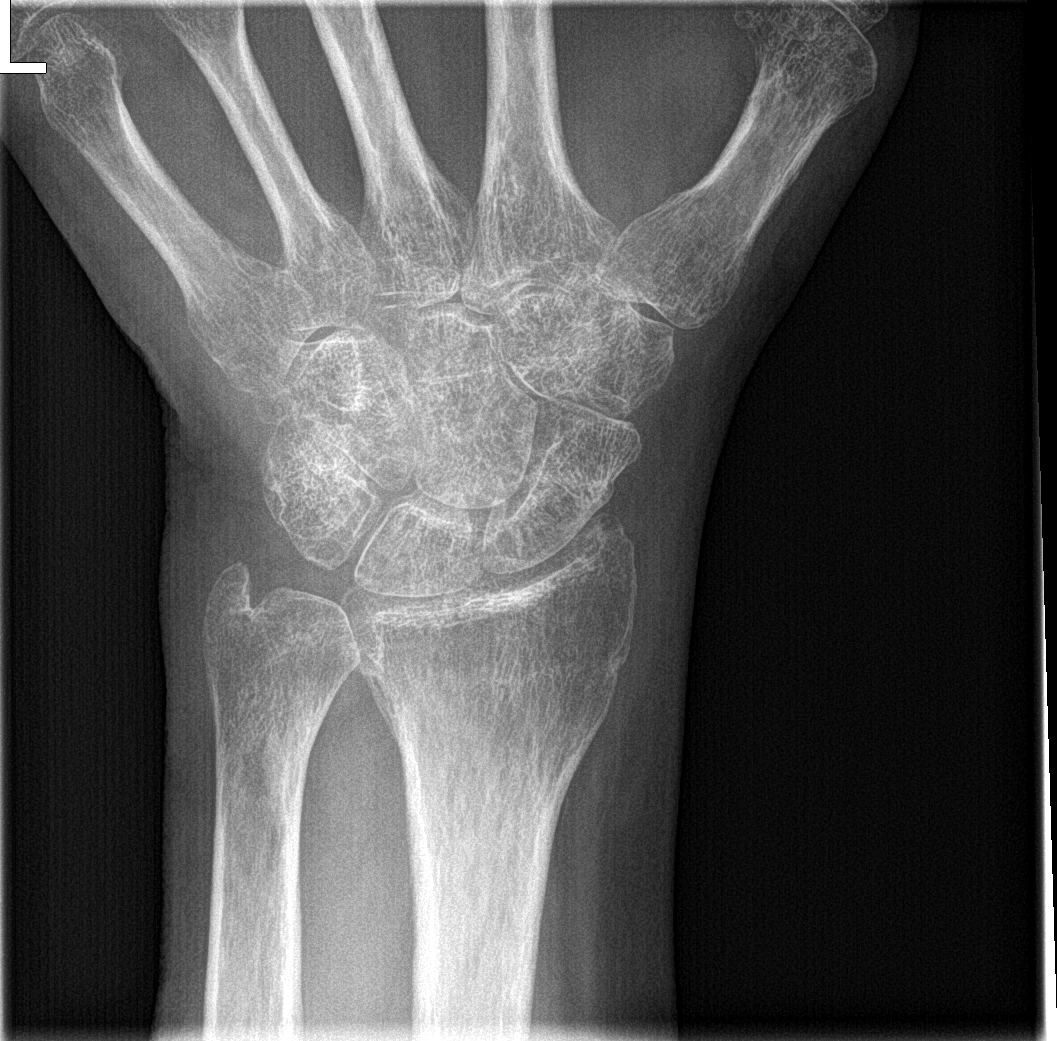

[4 of 4 positions shown; findings below may reference images not displayed]

FINDINGS: Severe osteopenia.

Transverse band of sclerosis in the distal radial metaphysis
consistent with a healing or impacted fracture similar in appearance
to the prior exam.

No other acute fracture or dislocation. Mild osteoarthritis of the
first MCP joint.

Soft tissues are normal.
IMPRESSION: Transverse band of sclerosis in the distal radial metaphysis
consistent with a healing or impacted fracture similar in appearance
to the prior exam.

## 2019-09-09 ENCOUNTER — Telehealth: Payer: Self-pay

## 2019-09-09 DIAGNOSIS — Z125 Encounter for screening for malignant neoplasm of prostate: Secondary | ICD-10-CM

## 2019-09-09 DIAGNOSIS — Z0000 Encounter for general adult medical examination without abnormal findings: Secondary | ICD-10-CM

## 2019-09-09 DIAGNOSIS — E782 Mixed hyperlipidemia: Secondary | ICD-10-CM

## 2019-09-09 NOTE — Telephone Encounter (Signed)
Ok with plan

## 2019-09-09 NOTE — Telephone Encounter (Signed)
Warden walked in and requested lab orders for his up coming physical.   Ordered Lipid CMP CBC PSA

## 2019-09-11 LAB — LIPID PANEL W/REFLEX DIRECT LDL
Cholesterol: 372 mg/dL — ABNORMAL HIGH (ref <200)
HDL: 31 mg/dL — ABNORMAL LOW (ref >=40)
Non-HDL Cholesterol (Calc): 341 mg/dL (calc) — ABNORMAL HIGH (ref <130)
Total CHOL/HDL Ratio: 12 (calc) — ABNORMAL HIGH (ref <5.0)
Triglycerides: 878 mg/dL — ABNORMAL HIGH (ref <150)

## 2019-09-11 LAB — COMPLETE METABOLIC PANEL WITH GFR
AG Ratio: 1.6 (calc) (ref 1.0–2.5)
ALT: 16 U/L (ref 9–46)
AST: 17 U/L (ref 10–35)
Albumin: 4.2 g/dL (ref 3.6–5.1)
Alkaline phosphatase (APISO): 60 U/L (ref 35–144)
BUN: 21 mg/dL (ref 7–25)
CO2: 23 mmol/L (ref 20–32)
Calcium: 10.1 mg/dL (ref 8.6–10.3)
Chloride: 104 mmol/L (ref 98–110)
Creat: 0.94 mg/dL (ref 0.70–1.25)
GFR, Est African American: 100 mL/min/{1.73_m2} (ref >=60)
GFR, Est Non African American: 87 mL/min/{1.73_m2} (ref >=60)
Globulin: 2.7 g/dL (calc) (ref 1.9–3.7)
Glucose, Bld: 91 mg/dL (ref 65–99)
Potassium: 4.5 mmol/L (ref 3.5–5.3)
Sodium: 136 mmol/L (ref 135–146)
Total Bilirubin: 0.6 mg/dL (ref 0.2–1.2)
Total Protein: 6.9 g/dL (ref 6.1–8.1)

## 2019-09-11 LAB — CBC WITH DIFFERENTIAL/PLATELET
Absolute Monocytes: 902 cells/uL (ref 200–950)
Basophils Absolute: 49 cells/uL (ref 0–200)
Basophils Relative: 0.5 %
Eosinophils Absolute: 353 cells/uL (ref 15–500)
Eosinophils Relative: 3.6 %
HCT: 44.7 % (ref 38.5–50.0)
Hemoglobin: 15 g/dL (ref 13.2–17.1)
Lymphs Abs: 2372 cells/uL (ref 850–3900)
MCH: 29.2 pg (ref 27.0–33.0)
MCHC: 33.6 g/dL (ref 32.0–36.0)
MCV: 87 fL (ref 80.0–100.0)
MPV: 10.7 fL (ref 7.5–12.5)
Monocytes Relative: 9.2 %
Neutro Abs: 6125 cells/uL (ref 1500–7800)
Neutrophils Relative %: 62.5 %
Platelets: 238 10*3/uL (ref 140–400)
RBC: 5.14 10*6/uL (ref 4.20–5.80)
RDW: 13.3 % (ref 11.0–15.0)
Total Lymphocyte: 24.2 %
WBC: 9.8 10*3/uL (ref 3.8–10.8)

## 2019-09-11 LAB — DIRECT LDL: Direct LDL: 78 mg/dL (ref <100)

## 2019-09-11 LAB — PSA: PSA: 0.5 ng/mL (ref <=4.0)

## 2019-09-12 ENCOUNTER — Encounter: Payer: Self-pay | Admitting: Physician Assistant

## 2019-09-12 NOTE — Telephone Encounter (Signed)
Call pt: will discuss more at upcoming visit.  Your TG are very elevated. Your total cholesterol is very high but suspect fasely elevated due to TG since direct LDL is 78 and that looks really good. I would like to put you on fish oil prescriptions strength and recheck in 4 months. We can discuss when you come in.   PSA looks good and stable from 9 years ago.  No anemia.  Kidney, liver, glucose look good.

## 2019-09-14 ENCOUNTER — Ambulatory Visit (INDEPENDENT_AMBULATORY_CARE_PROVIDER_SITE_OTHER): Payer: Self-pay | Admitting: Physician Assistant

## 2019-09-14 ENCOUNTER — Other Ambulatory Visit: Payer: Self-pay

## 2019-09-14 ENCOUNTER — Encounter: Payer: Self-pay | Admitting: Physician Assistant

## 2019-09-14 VITALS — BP 132/85 | HR 77 | Ht 71.0 in | Wt 210.0 lb

## 2019-09-14 DIAGNOSIS — E781 Pure hyperglyceridemia: Secondary | ICD-10-CM

## 2019-09-14 DIAGNOSIS — Z0000 Encounter for general adult medical examination without abnormal findings: Secondary | ICD-10-CM

## 2019-09-14 MED ORDER — FENOFIBRATE 145 MG PO TABS: 145.0000 mg | ORAL_TABLET | Freq: Every day | ORAL | 3 refills | Status: AC

## 2019-09-14 NOTE — Patient Instructions (Addendum)
High Triglycerides Eating Plan Triglycerides are a type of fat in the blood. High levels of triglycerides can increase your risk of heart disease and stroke. If your triglyceride levels are high, choosing the right foods can help lower your triglycerides and keep your heart healthy. Work with your health care provider or a diet and nutrition specialist (dietitian) to develop an eating plan that is right for you. What are tips for following this plan? General guidelines   Lose weight, if you are overweight. For most people, losing 5-10 lbs (2-5 kg) helps lower triglyceride levels. A weight-loss plan may include. ? 30 minutes of exercise at least 5 days a week. ? Reducing the amount of calories, sugar, and fat you eat.  Eat a wide variety of fresh fruits, vegetables, and whole grains. These foods are high in fiber.  Eat foods that contain healthy fats, such as fatty fish, nuts, seeds, and olive oil.  Avoid foods that are high in added sugar, added salt (sodium), saturated fat, and trans fat.  Avoid low-fiber, refined carbohydrates such as white bread, crackers, noodles, and white rice.  Avoid foods with partially hydrogenated oils (trans fats), such as fried foods or stick margarine.  Limit alcohol intake to no more than 1 drink a day for nonpregnant women and 2 drinks a day for men. One drink equals 12 oz of beer, 5 oz of wine, or 1 oz of hard liquor. Your health care provider may recommend that you drink less depending on your overall health. Reading food labels  Check food labels for the amount of saturated fat. Choose foods with no or very little saturated fat.  Check food labels for the amount of trans fat. Choose foods with no trans fat.  Check food labels for the amount of cholesterol. Choose foods low in cholesterol. Ask your dietitian how much cholesterol you should have each day.  Check food labels for the amount of sodium. Choose foods with less than 140 milligrams (mg) per  serving. Shopping  Buy dairy products labeled as nonfat (skim) or low-fat (1%).  Avoid buying processed or prepackaged foods. These are often high in added sugar, sodium, and fat. Cooking  Choose healthy fats when cooking, such as olive oil or canola oil.  Cook foods using lower fat methods, such as baking, broiling, boiling, or grilling.  Make your own sauces, dressings, and marinades when possible, instead of buying them. Store-bought sauces, dressings, and marinades are often high in sodium and sugar. Meal planning  Eat more home-cooked food and less restaurant, buffet, and fast food.  Eat fatty fish at least 2 times each week. Examples of fatty fish include salmon, trout, mackerel, tuna, and herring.  If you eat whole eggs, do not eat more than 3 egg yolks per week. What foods are recommended? The items listed may not be a complete list. Talk with your dietitian about what dietary choices are best for you. Grains Whole wheat or whole grain breads, crackers, cereals, and pasta. Unsweetened oatmeal. Bulgur. Barley. Quinoa. Brown rice. Whole wheat flour tortillas. Vegetables Fresh or frozen vegetables. Low-sodium canned vegetables. Fruits All fresh, canned (in natural juice), or frozen fruits. Meats and other protein foods Skinless chicken or turkey. Ground chicken or turkey. Lean cuts of pork, trimmed of fat. Fish and seafood, especially salmon, trout, and herring. Egg whites. Dried beans, peas, or lentils. Unsalted nuts or seeds. Unsalted canned beans. Natural peanut or almond butter. Dairy Low-fat dairy products. Skim or low-fat (1%) milk. Reduced fat (  2%) and low-sodium cheese. Low-fat ricotta cheese. Low-fat cottage cheese. Plain, low-fat yogurt. Fats and oils Tub margarine without trans fats. Light or reduced-fat mayonnaise. Light or reduced-fat salad dressings. Avocado. Safflower, olive, sunflower, soybean, and canola oils. What foods are not recommended? The items listed  may not be a complete list. Talk with your dietitian about what dietary choices are best for you. Grains White bread. White (regular) pasta. White rice. Cornbread. Bagels. Pastries. Crackers that contain trans fat. Vegetables Creamed or fried vegetables. Vegetables in a cheese sauce. Fruits Sweetened dried fruit. Canned fruit in syrup. Fruit juice. Meats and other protein foods Fatty cuts of meat. Ribs. Chicken wings. Berniece Salines. Sausage. Bologna. Salami. Chitterlings. Fatback. Hot dogs. Bratwurst. Packaged lunch meats. Dairy Whole or reduced-fat (2%) milk. Half-and-half. Cream cheese. Full-fat or sweetened yogurt. Full-fat cheese. Nondairy creamers. Whipped toppings. Processed cheese or cheese spreads. Cheese curds. Beverages Alcohol. Sweetened drinks, such as soda, lemonade, fruit drinks, or punches. Fats and oils Butter. Stick margarine. Lard. Shortening. Ghee. Bacon fat. Tropical oils, such as coconut, palm kernel, or palm oils. Sweets and desserts Corn syrup. Sugars. Honey. Molasses. Candy. Jam and jelly. Syrup. Sweetened cereals. Cookies. Pies. Cakes. Donuts. Muffins. Ice cream. Condiments Store-bought sauces, dressings, and marinades that are high in sugar, such as ketchup and barbecue sauce. Summary  High levels of triglycerides can increase the risk of heart disease and stroke. Choosing the right foods can help lower your triglycerides.  Eat plenty of fresh fruits, vegetables, and whole grains. Choose low-fat dairy and lean meats. Eat fatty fish at least twice a week.  Avoid processed and prepackaged foods with added sugar, sodium, saturated fat, and trans fat.  If you need suggestions or have questions about what types of food are good for you, talk with your health care provider or a dietitian. This information is not intended to replace advice given to you by your health care provider. Make sure you discuss any questions you have with your health care provider. Document Revised:  06/05/2017 Document Reviewed: 08/26/2016 Elsevier Patient Education  2020 [place] Maintenance, Male Adopting a healthy lifestyle and getting preventive care are important in promoting health and wellness. Ask your health care provider about:  The right schedule for you to have regular tests and exams.  Things you can do on your own to prevent diseases and keep yourself healthy. What should I know about diet, weight, and exercise? Eat a healthy diet   Eat a diet that includes plenty of vegetables, fruits, low-fat dairy products, and lean protein.  Do not eat a lot of foods that are high in solid fats, added sugars, or sodium. Maintain a healthy weight Body mass index (BMI) is a measurement that can be used to identify possible weight problems. It estimates body fat based on height and weight. Your health care provider can help determine your BMI and help you achieve or maintain a healthy weight. Get regular exercise Get regular exercise. This is one of the most important things you can do for your health. Most adults should:  Exercise for at least 150 minutes each week. The exercise should increase your heart rate and make you sweat (moderate-intensity exercise).  Do strengthening exercises at least twice a week. This is in addition to the moderate-intensity exercise.  Spend less time sitting. Even light physical activity can be beneficial. Watch cholesterol and blood lipids Have your blood tested for lipids and cholesterol at 64 years of age, then have this test every 5  years. You may need to have your cholesterol levels checked more often if:  Your lipid or cholesterol levels are high.  You are older than 64 years of age.  You are at high risk for heart disease. What should I know about cancer screening? Many types of cancers can be detected early and may often be prevented. Depending on your health history and family history, you may need to have cancer screening  at various ages. This may include screening for:  Colorectal cancer.  Prostate cancer.  Skin cancer.  Lung cancer. What should I know about heart disease, diabetes, and high blood pressure? Blood pressure and heart disease  High blood pressure causes heart disease and increases the risk of stroke. This is more likely to develop in people who have high blood pressure readings, are of African descent, or are overweight.  Talk with your health care provider about your target blood pressure readings.  Have your blood pressure checked: ? Every 3-5 years if you are 33-43 years of age. ? Every year if you are 30 years old or older.  If you are between the ages of 86 and 45 and are a current or former smoker, ask your health care provider if you should have a one-time screening for abdominal aortic aneurysm (AAA). Diabetes Have regular diabetes screenings. This checks your fasting blood sugar level. Have the screening done:  Once every three years after age 67 if you are at a normal weight and have a low risk for diabetes.  More often and at a younger age if you are overweight or have a high risk for diabetes. What should I know about preventing infection? Hepatitis B If you have a higher risk for hepatitis B, you should be screened for this virus. Talk with your health care provider to find out if you are at risk for hepatitis B infection. Hepatitis C Blood testing is recommended for:  Everyone born from [birth year] through 1964.  Anyone with known risk factors for hepatitis C. Sexually transmitted infections (STIs)  You should be screened each year for STIs, including gonorrhea and chlamydia, if: ? You are sexually active and are younger than 63 years of age. ? You are older than 63 years of age and your health care provider tells you that you are at risk for this type of infection. ? Your sexual activity has changed since you were last screened, and you are at increased risk for  chlamydia or gonorrhea. Ask your health care provider if you are at risk.  Ask your health care provider about whether you are at high risk for HIV. Your health care provider may recommend a prescription medicine to help prevent HIV infection. If you choose to take medicine to prevent HIV, you should first get tested for HIV. You should then be tested every 3 months for as long as you are taking the medicine. Follow these instructions at home: Lifestyle  Do not use any products that contain nicotine or tobacco, such as cigarettes, e-cigarettes, and chewing tobacco. If you need help quitting, ask your health care provider.  Do not use street drugs.  Do not share needles.  Ask your health care provider for help if you need support or information about quitting drugs. Alcohol use  Do not drink alcohol if your health care provider tells you not to drink.  If you drink alcohol: ? Limit how much you have to 0-2 drinks a day. ? Be aware of how much alcohol is in  your drink. In the [place] one drink equals one 12 oz bottle of beer (355 mL), one 5 oz glass of wine (148 mL), or one 1 oz glass of hard liquor (44 mL). General instructions  Schedule regular health, dental, and eye exams.  Stay current with your vaccines.  Tell your health care provider if: ? You often feel depressed. ? You have ever been abused or do not feel safe at home. Summary  Adopting a healthy lifestyle and getting preventive care are important in promoting health and wellness.  Follow your health care provider's instructions about healthy diet, exercising, and getting tested or screened for diseases.  Follow your health care provider's instructions on monitoring your cholesterol and blood pressure. This information is not intended to replace advice given to you by your health care provider. Make sure you discuss any questions you have with your health care provider. Document Revised: 06/16/2018 Document Reviewed:  06/16/2018 Elsevier Patient Education  2020 [employer].

## 2019-09-16 NOTE — Progress Notes (Signed)
Subjective:    Patient ID: Mark Carson, male    DOB: 10/06/1955, 64 y.o.   MRN: [id number]  HPI  Pt is a 64 yo male who presents to the clinic for CPE.   No problems or concerns. He is very active and rides his mountain bike. He sleeps well and feels great.   .. Active Ambulatory Problems    Diagnosis Date Noted  . Hypertriglyceridemia 03/29/2009  . CONDUCTIVE HEARING LOSS BILATERAL 01/01/2010  . CONSTIPATION 12/20/2010  . BPH (benign prostatic hyperplasia) 05/21/2012  . Multiple facial fractures (HCC) 05/25/2015  . Lumbar degenerative disc disease 11/27/2016  . Distal radius fracture, left 07/08/2017  . Dupuytren's contracture of left hand 06/08/2018   Resolved Ambulatory Problems    Diagnosis Date Noted  . HORDEOLUM 03/29/2009  . WEIGHT LOSS, RECENT 12/20/2010  . Pure hypercholesterolemia 08/06/2016   Past Medical History:  Diagnosis Date  . Hyperlipidemia   . Kidney stones    .Marland Kitchen Family History  Problem Relation Age of Onset  . Hypertension Mother    .Marland Kitchen Social History   Socioeconomic History  . Marital status: Divorced    Spouse name: Not on file  . Number of children: Not on file  . Years of education: Not on file  . Highest education level: Not on file  Occupational History  . Not on file  Tobacco Use  . Smoking status: Never Smoker  . Smokeless tobacco: Never Used  Substance and Sexual Activity  . Alcohol use: Yes    Alcohol/week: 6.0 standard drinks    Types: 6 Standard drinks or equivalent per week    Comment: per week  . Drug use: No  . Sexual activity: Not on file  Other Topics Concern  . Not on file  Social History Narrative  . Not on file   Social Determinants of Health   Financial Resource Strain:   . Difficulty of Paying Living Expenses:   Food Insecurity:   . Worried About Programme researcher, broadcasting/film/video in the Last Year:   . Barista in the Last Year:   Transportation Needs:   . Freight forwarder (Medical):   Marland Kitchen Lack of  Transportation (Non-Medical):   Physical Activity:   . Days of Exercise per Week:   . Minutes of Exercise per Session:   Stress:   . Feeling of Stress :   Social Connections:   . Frequency of Communication with Friends and Family:   . Frequency of Social Gatherings with Friends and Family:   . Attends Religious Services:   . Active Member of Clubs or Organizations:   . Attends Banker Meetings:   Marland Kitchen Marital Status:   Intimate Partner Violence:   . Fear of Current or Ex-Partner:   . Emotionally Abused:   Marland Kitchen Physically Abused:   . Sexually Abused:        Review of Systems  All other systems reviewed and are negative.      Objective:   Physical Exam  BP 132/85   Pulse 77   Ht 5\' 11"  (1.803 m)   Wt 210 lb (95.3 kg)   SpO2 98%   BMI 29.29 kg/m   General Appearance:    Alert, cooperative, no distress, appears stated age  Head:    Normocephalic, without obvious abnormality, atraumatic  Eyes:    PERRL, conjunctiva/corneas clear, EOM's intact, fundi    benign, both eyes       Ears:    Normal  TM's and external ear canals, both ears  Nose:   Nares normal, septum midline, mucosa normal, no drainage    or sinus tenderness  Throat:   Lips, mucosa, and tongue normal; teeth and gums normal  Neck:   Supple, symmetrical, trachea midline, no adenopathy;       thyroid:  No enlargement/tenderness/nodules; no carotid   bruit or JVD  Back:     Symmetric, no curvature, ROM normal, no CVA tenderness  Lungs:     Clear to auscultation bilaterally, respirations unlabored  Chest wall:    No tenderness or deformity  Heart:    Regular rate and rhythm, S1 and S2 normal, no murmur, rub   or gallop  Abdomen:     Soft, non-tender, bowel sounds active all four quadrants,    no masses, no organomegaly        Extremities:   Extremities normal, atraumatic, no cyanosis or edema  Pulses:   2+ and symmetric all extremities  Skin:   Skin color, texture, turgor normal, no rashes or lesions   Lymph nodes:   Cervical, supraclavicular, and axillary nodes normal  Neurologic:   CNII-XII intact. Normal strength, sensation and reflexes      throughout        Assessment & Plan:  .Marland KitchenJohn was seen today for annual exam.  Diagnoses and all orders for this visit:  Routine physical examination  Hypertriglyceridemia -     fenofibrate (TRICOR) 145 MG tablet; Take 1 tablet (145 mg total) by mouth daily.   Marland Kitchen.Start a regular exercise program and make sure you are eating a healthy diet Try to eat 4 servings of dairy a day or take a calcium supplement (500mg  twice a day). Pt declines shingles vaccine he already has zostavax.  Pt denies colonoscopy/flu/Tdap/hep C because he does not have insurance.  .. Lazy Y U Name 09/14/19 1400         During the last Month   Sensation of Bladder not Empty  Not at all     Urinate<2 hours after last  Not at all     Mult. stop/start when voiding  Not at all     Difficult to postpone voiding  Not at all     Weak urinary stream  Less than 1 time in 5     Push/strain to begin urination  Not at all     Times per night up to urinate  Less than 1 time in 5       OTHER   Total Score  2        Labs look great except for high TG. Start tricor. Discussed diet changes. Recheck in 6 months.
